# Patient Record
Sex: Male | Born: 1970 | Race: White | Hispanic: No | Marital: Married | Smoking: Former smoker
Health system: Southern US, Community
[De-identification: ages and names within clinical notes are randomized; demographics above are authoritative.]

## PROBLEM LIST (undated history)

## (undated) DIAGNOSIS — R011 Cardiac murmur, unspecified: Secondary | ICD-10-CM

## (undated) HISTORY — PX: TONSILLECTOMY: SUR1361

## (undated) HISTORY — DX: Cardiac murmur, unspecified: R01.1

---

## 2005-04-04 ENCOUNTER — Ambulatory Visit: Payer: Self-pay | Admitting: Internal Medicine

## 2009-01-05 ENCOUNTER — Ambulatory Visit: Payer: Self-pay | Admitting: Internal Medicine

## 2009-09-27 ENCOUNTER — Encounter: Payer: Self-pay | Admitting: Internal Medicine

## 2009-10-04 ENCOUNTER — Telehealth: Payer: Self-pay | Admitting: Internal Medicine

## 2009-10-10 ENCOUNTER — Ambulatory Visit: Payer: Self-pay | Admitting: Internal Medicine

## 2009-10-11 LAB — CONVERTED CEMR LAB
ALT: 37 units/L (ref 0–53)
Albumin: 4.5 g/dL (ref 3.5–5.2)
Basophils Relative: 0.1 % (ref 0.0–3.0)
Bilirubin, Direct: 0.1 mg/dL (ref 0.0–0.3)
CO2: 28 meq/L (ref 19–32)
Chloride: 104 meq/L (ref 96–112)
Cholesterol: 192 mg/dL (ref 0–200)
Eosinophils Relative: 6.3 % — ABNORMAL HIGH (ref 0.0–5.0)
HCT: 43.4 % (ref 39.0–52.0)
Hemoglobin: 14.9 g/dL (ref 13.0–17.0)
LDL Cholesterol: 132 mg/dL — ABNORMAL HIGH (ref 0–99)
Lymphs Abs: 1.5 10*3/uL (ref 0.7–4.0)
MCV: 90.7 fL (ref 78.0–100.0)
Monocytes Absolute: 0.6 10*3/uL (ref 0.1–1.0)
Neutro Abs: 3.3 10*3/uL (ref 1.4–7.7)
Neutrophils Relative %: 57.8 % (ref 43.0–77.0)
Potassium: 4.5 meq/L (ref 3.5–5.1)
RBC: 4.78 M/uL (ref 4.22–5.81)
Sodium: 139 meq/L (ref 135–145)
Total Protein: 7.6 g/dL (ref 6.0–8.3)
WBC: 5.8 10*3/uL (ref 4.5–10.5)

## 2010-01-23 ENCOUNTER — Ambulatory Visit: Payer: Self-pay | Admitting: Internal Medicine

## 2011-01-08 NOTE — Assessment & Plan Note (Signed)
Summary: CPX/CLE   Vital Signs:  Patient profile:   40 year old male Height:      72 inches Weight:      230 pounds BMI:     31.31 Temp:     98.1 degrees F oral Pulse rate:   76 / minute Pulse rhythm:   regular BP sitting:   120 / 80  (left arm) Cuff size:   regular  Vitals Entered By: Linde Gillis CMA Duncan Dull) (January 23, 2010 8:15 AM) CC: 30 minute exam   History of Present Illness: Doing well No new concerns  Not exercising that much Tends to take the winter "off" of exercise  No longer dipping  Allergies: 1)  Penicillin V Potassium (Penicillin V Potassium)  Past History:  Past medical, surgical, family and social histories (including risk factors) reviewed for relevance to current acute and chronic problems.  Past Medical History: Reviewed history from 01/05/2009 and no changes required. Unremarkable  Past Surgical History: Reviewed history from 01/05/2009 and no changes required. Denies surgical history  Family History: Reviewed history from 01/05/2009 and no changes required. Dad with DM 1 sister CAD on dad's side HTN on mom's side No prostate or colon cancer Pat GM had ?RA  Social History: Reviewed history from 01/05/2009 and no changes required. 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 Eyes:  Denies double vision and vision loss-1 eye. ENT:  Denies decreased hearing and ringing in ears; teeth okay--regular with dentist. CV:  Denies chest pain or discomfort, difficulty breathing at night, difficulty breathing while lying down, fainting, lightheadness, palpitations, and shortness of breath with exertion. Resp:  Denies cough and shortness of breath. GI:  Denies abdominal pain, bloody stools, change in bowel habits, dark tarry stools, indigestion, nausea, and vomiting. GU:  Denies  erectile dysfunction, urinary frequency, and urinary hesitancy. MS:  Denies joint pain and joint swelling. Derm:  Denies lesion(s) and rash. Neuro:  Complains of tingling; denies headaches, numbness, and weakness; notes some tingling in hands in bed at times. Psych:  Denies anxiety and depression. Heme:  Denies abnormal bruising and enlarge lymph nodes. Allergy:  Denies seasonal allergies and sneezing.  Physical Exam  General:  alert and normal appearance.   Eyes:  pupils equal, pupils round, pupils reactive to light, and no optic disk abnormalities.   Ears:  R ear normal and L ear normal.   Mouth:  no erythema and no lesions.   Neck:  supple, no masses, no thyromegaly, no carotid bruits, and no cervical lymphadenopathy.   Lungs:  normal respiratory effort and normal breath sounds.   Heart:  normal rate, regular rhythm, no murmur, and no gallop.   Abdomen:  soft, non-tender, and no masses.   Msk:  no joint tenderness and no joint swelling.   Pulses:  normal in feet Extremities:  no edema Neurologic:  alert & oriented X3 and strength normal in all extremities.   Skin:  no rashes and no suspicious lesions.   Axillary Nodes:  No palpable lymphadenopathy Psych:  normally interactive, good eye contact, not anxious appearing, and not depressed appearing.     Impression & Recommendations:  Problem # 1:  HEALTH MAINTENANCE EXAM (ICD-V70.0) Assessment Comment Only healthy but not in great shape discussed fitness  Patient Instructions: 1)  Please schedule a follow-up appointment in 2 years.   Prior Medications (  reviewed today): None Current Allergies (reviewed today): PENICILLIN V POTASSIUM (PENICILLIN V POTASSIUM)

## 2012-05-25 ENCOUNTER — Ambulatory Visit: Payer: Self-pay

## 2012-05-25 LAB — RAPID STREP-A WITH REFLX: Micro Text Report: NEGATIVE

## 2012-05-27 LAB — BETA STREP CULTURE(ARMC)

## 2013-03-19 ENCOUNTER — Ambulatory Visit: Payer: Self-pay | Admitting: Family Medicine

## 2015-03-27 ENCOUNTER — Ambulatory Visit
Admit: 2015-03-27 | Disposition: A | Discharge status: Home / Self Care | Payer: Self-pay | Attending: Family Medicine | Admitting: Family Medicine

## 2017-06-24 ENCOUNTER — Encounter: Payer: Self-pay | Admitting: Internal Medicine

## 2017-06-24 ENCOUNTER — Ambulatory Visit (INDEPENDENT_AMBULATORY_CARE_PROVIDER_SITE_OTHER): Payer: BLUE CROSS/BLUE SHIELD | Admitting: Internal Medicine

## 2017-06-24 DIAGNOSIS — Z0000 Encounter for general adult medical examination without abnormal findings: Secondary | ICD-10-CM

## 2017-06-24 LAB — LIPID PANEL
CHOL/HDL RATIO: 4
Cholesterol: 226 mg/dL — ABNORMAL HIGH (ref 0–200)
HDL: 56 mg/dL (ref >39.00)
LDL Cholesterol: 144 mg/dL — ABNORMAL HIGH (ref 0–99)
NONHDL: 169.66
TRIGLYCERIDES: 128 mg/dL (ref 0.0–149.0)
VLDL: 25.6 mg/dL (ref 0.0–40.0)

## 2017-06-24 LAB — GLUCOSE, RANDOM: Glucose, Bld: 91 mg/dL (ref 70–99)

## 2017-06-24 NOTE — Assessment & Plan Note (Signed)
Working on fitness UTD on Tdap---discussed yearly flu vaccine No cancer screening due to age Check lipid and glucose

## 2017-06-24 NOTE — Progress Notes (Signed)
Subjective:    Patient ID: Ethan Aguilar., male    DOB: 1971/03/17, 46 y.o.   MRN: [id number]  HPI Here to reestablish care and for PE  No health concerns Knows he has not been as active FH of diabetes   Has started walking more regularly--runs up the hills Has lost 15# recently  No current outpatient prescriptions on file prior to visit.   No current facility-administered medications on file prior to visit.     Allergies  Allergen Reactions  . Penicillins     REACTION: rash----not sure, had something as an infant    No past medical history on file.  No past surgical history on file.  Family History  Problem Relation Age of Onset  . Cataracts Mother   . Glaucoma Mother   . Hypertension Mother   . Cataracts Father   . Diabetes Father 69  . Hypertension Father   . Tremor Sister   . Mental illness Maternal Grandmother   . Lung cancer Maternal Grandfather   . Arthritis Paternal Grandmother   . Varicose Veins Paternal Grandmother   . Heart attack Paternal Grandfather   . Prostate cancer Paternal Grandfather     Social History   Social History  . Marital status: Married    Spouse name: N/A  . Number of children: 2  . Years of education: N/A   Occupational History  . Financial planner [name]   Social History Main Topics  . Smoking status: Former Research scientist (life sciences)  . Smokeless tobacco: Former Systems developer    Types: Snuff    Quit date: 12/09/2012     Comment: Only smoked during test time in college  . Alcohol use Yes     Comment: rare  . Drug use: Unknown  . Sexual activity: Not on file   Other Topics Concern  . Not on file   Social History Narrative  . No narrative on file   Review of Systems  Constitutional: Negative for fatigue.       Wears seat belt  HENT: Negative for dental problem, hearing loss, tinnitus and trouble swallowing.        Keeps up with dentist  Eyes: Negative for visual disturbance.       No diplopia or unilateral vision loss    Respiratory: Negative for cough, chest tightness and shortness of breath.   Cardiovascular: Negative for chest pain, palpitations and leg swelling.  Gastrointestinal: Negative for abdominal pain, blood in stool and constipation.       Slight twinge of heartburn--no meds  Endocrine: Negative for polydipsia and polyuria.  Genitourinary: Negative for difficulty urinating and urgency.       No ED  Musculoskeletal: Negative for arthralgias, back pain and joint swelling.  Skin: Negative for rash.       Has seen dermatologist for benign lesions  Allergic/Immunologic: Positive for environmental allergies. Negative for immunocompromised state.       No meds  Neurological: Negative for dizziness, syncope and light-headedness.       Rare sinus headache  Hematological: Negative for adenopathy. Does not bruise/bleed easily.  Psychiatric/Behavioral: Negative for dysphoric mood and sleep disturbance. The patient is not nervous/anxious.        Objective:   Physical Exam  Constitutional: He is oriented to person, place, and time. He appears well-developed and well-nourished. No distress.  HENT:  Head: Normocephalic and atraumatic.  Right Ear: External ear normal.  Left Ear: External ear normal.  Mouth/Throat: Oropharynx is clear and  moist.  Eyes: Pupils are equal, round, and reactive to light. Conjunctivae are normal.  Neck: Normal range of motion. Neck supple. No thyromegaly present.  Cardiovascular: Normal rate, regular rhythm, normal heart sounds and intact distal pulses.  Exam reveals no gallop.   No murmur heard. Pulmonary/Chest: Effort normal and breath sounds normal. No respiratory distress. He has no wheezes. He has no rales.  Abdominal: Soft. There is no tenderness.  Musculoskeletal: He exhibits no edema or tenderness.  Lymphadenopathy:    He has no cervical adenopathy.  Neurological: He is alert and oriented to person, place, and time.  Skin: No rash noted. No erythema.  Psychiatric:  He has a normal mood and affect. His behavior is normal.          Assessment & Plan:

## 2017-06-24 NOTE — Patient Instructions (Signed)
DASH Eating Plan DASH stands for "Dietary Approaches to Stop Hypertension." The DASH eating plan is a healthy eating plan that has been shown to reduce high blood pressure (hypertension). It may also reduce your risk for type 2 diabetes, heart disease, and stroke. The DASH eating plan may also help with weight loss. What are tips for following this plan? General guidelines  Avoid eating more than 2,300 mg (milligrams) of salt (sodium) a day. If you have hypertension, you may need to reduce your sodium intake to 1,500 mg a day.  Limit alcohol intake to no more than 1 drink a day for nonpregnant women and 2 drinks a day for men. One drink equals 12 oz of beer, 5 oz of wine, or 1 oz of hard liquor.  Work with your health care provider to maintain a healthy body weight or to lose weight. Ask what an ideal weight is for you.  Get at least 30 minutes of exercise that causes your heart to beat faster (aerobic exercise) most days of the week. Activities may include walking, swimming, or biking.  Work with your health care provider or diet and nutrition specialist (dietitian) to adjust your eating plan to your individual calorie needs. Reading food labels  Check food labels for the amount of sodium per serving. Choose foods with less than 5 percent of the Daily Value of sodium. Generally, foods with less than 300 mg of sodium per serving fit into this eating plan.  To find whole grains, look for the word "whole" as the first word in the ingredient list. Shopping  Buy products labeled as "low-sodium" or "no salt added."  Buy fresh foods. Avoid canned foods and premade or frozen meals. Cooking  Avoid adding salt when cooking. Use salt-free seasonings or herbs instead of table salt or sea salt. Check with your health care provider or pharmacist before using salt substitutes.  Do not fry foods. Cook foods using healthy methods such as baking, boiling, grilling, and broiling instead.  Cook with  heart-healthy oils, such as olive, canola, soybean, or sunflower oil. Meal planning   Eat a balanced diet that includes: ? 5 or more servings of fruits and vegetables each day. At each meal, try to fill half of your plate with fruits and vegetables. ? Up to 6-8 servings of whole grains each day. ? Less than 6 oz of lean meat, poultry, or fish each day. A 3-oz serving of meat is about the same size as a deck of cards. One egg equals 1 oz. ? 2 servings of low-fat dairy each day. ? A serving of nuts, seeds, or beans 5 times each week. ? Heart-healthy fats. Healthy fats called Omega-3 fatty acids are found in foods such as flaxseeds and coldwater fish, like sardines, salmon, and mackerel.  Limit how much you eat of the following: ? Canned or prepackaged foods. ? Food that is high in trans fat, such as fried foods. ? Food that is high in saturated fat, such as fatty meat. ? Sweets, desserts, sugary drinks, and other foods with added sugar. ? Full-fat dairy products.  Do not salt foods before eating.  Try to eat at least 2 vegetarian meals each week.  Eat more home-cooked food and less restaurant, buffet, and fast food.  When eating at a restaurant, ask that your food be prepared with less salt or no salt, if possible. What foods are recommended? The items listed may not be a complete list. Talk with your dietitian about what   dietary choices are best for you. Grains Whole-grain or whole-wheat bread. Whole-grain or whole-wheat pasta. Brown rice. Oatmeal. Quinoa. Bulgur. Whole-grain and low-sodium cereals. Pita bread. Low-fat, low-sodium crackers. Whole-wheat flour tortillas. Vegetables Fresh or frozen vegetables (raw, steamed, roasted, or grilled). Low-sodium or reduced-sodium tomato and vegetable juice. Low-sodium or reduced-sodium tomato sauce and tomato paste. Low-sodium or reduced-sodium canned vegetables. Fruits All fresh, dried, or frozen fruit. Canned fruit in natural juice (without  added sugar). Meat and other protein foods Skinless chicken or turkey. Ground chicken or turkey. Pork with fat trimmed off. Fish and seafood. Egg whites. Dried beans, peas, or lentils. Unsalted nuts, nut butters, and seeds. Unsalted canned beans. Lean cuts of beef with fat trimmed off. Low-sodium, lean deli meat. Dairy Low-fat (1%) or fat-free (skim) milk. Fat-free, low-fat, or reduced-fat cheeses. Nonfat, low-sodium ricotta or cottage cheese. Low-fat or nonfat yogurt. Low-fat, low-sodium cheese. Fats and oils Soft margarine without trans fats. Vegetable oil. Low-fat, reduced-fat, or light mayonnaise and salad dressings (reduced-sodium). Canola, safflower, olive, soybean, and sunflower oils. Avocado. Seasoning and other foods Herbs. Spices. Seasoning mixes without salt. Unsalted popcorn and pretzels. Fat-free sweets. What foods are not recommended? The items listed may not be a complete list. Talk with your dietitian about what dietary choices are best for you. Grains Baked goods made with fat, such as croissants, muffins, or some breads. Dry pasta or rice meal packs. Vegetables Creamed or fried vegetables. Vegetables in a cheese sauce. Regular canned vegetables (not low-sodium or reduced-sodium). Regular canned tomato sauce and paste (not low-sodium or reduced-sodium). Regular tomato and vegetable juice (not low-sodium or reduced-sodium). Pickles. Olives. Fruits Canned fruit in a light or heavy syrup. Fried fruit. Fruit in cream or butter sauce. Meat and other protein foods Fatty cuts of meat. Ribs. Fried meat. Bacon. Sausage. Bologna and other processed lunch meats. Salami. Fatback. Hotdogs. Bratwurst. Salted nuts and seeds. Canned beans with added salt. Canned or smoked fish. Whole eggs or egg yolks. Chicken or turkey with skin. Dairy Whole or 2% milk, cream, and half-and-half. Whole or full-fat cream cheese. Whole-fat or sweetened yogurt. Full-fat cheese. Nondairy creamers. Whipped toppings.  Processed cheese and cheese spreads. Fats and oils Butter. Stick margarine. Lard. Shortening. Ghee. Bacon fat. Tropical oils, such as coconut, palm kernel, or palm oil. Seasoning and other foods Salted popcorn and pretzels. Onion salt, garlic salt, seasoned salt, table salt, and sea salt. Worcestershire sauce. Tartar sauce. Barbecue sauce. Teriyaki sauce. Soy sauce, including reduced-sodium. Steak sauce. Canned and packaged gravies. Fish sauce. Oyster sauce. Cocktail sauce. Horseradish that you find on the shelf. Ketchup. Mustard. Meat flavorings and tenderizers. Bouillon cubes. Hot sauce and Tabasco sauce. Premade or packaged marinades. Premade or packaged taco seasonings. Relishes. Regular salad dressings. Where to find more information:  National Heart, Lung, and Blood Institute: www.nhlbi.nih.gov  American Heart Association: www.heart.org Summary  The DASH eating plan is a healthy eating plan that has been shown to reduce high blood pressure (hypertension). It may also reduce your risk for type 2 diabetes, heart disease, and stroke.  With the DASH eating plan, you should limit salt (sodium) intake to 2,300 mg a day. If you have hypertension, you may need to reduce your sodium intake to 1,500 mg a day.  When on the DASH eating plan, aim to eat more fresh fruits and vegetables, whole grains, lean proteins, low-fat dairy, and heart-healthy fats.  Work with your health care provider or diet and nutrition specialist (dietitian) to adjust your eating plan to your individual   calorie needs. This information is not intended to replace advice given to you by your health care provider. Make sure you discuss any questions you have with your health care provider. Document Released: 11/14/2011 Document Revised: 11/18/2016 Document Reviewed: 11/18/2016 Elsevier Interactive Patient Education  2017 Elsevier Inc.  

## 2017-10-21 ENCOUNTER — Encounter: Payer: BLUE CROSS/BLUE SHIELD | Admitting: Internal Medicine

## 2018-10-21 ENCOUNTER — Ambulatory Visit (INDEPENDENT_AMBULATORY_CARE_PROVIDER_SITE_OTHER): Payer: BLUE CROSS/BLUE SHIELD | Admitting: Internal Medicine

## 2018-10-21 ENCOUNTER — Encounter: Payer: Self-pay | Admitting: Internal Medicine

## 2018-10-21 VITALS — BP 132/84 | HR 81 | Temp 98.6°F | Ht 73.5 in | Wt 248.0 lb

## 2018-10-21 DIAGNOSIS — Z23 Encounter for immunization: Secondary | ICD-10-CM | POA: Diagnosis not present

## 2018-10-21 DIAGNOSIS — Z0000 Encounter for general adult medical examination without abnormal findings: Secondary | ICD-10-CM

## 2018-10-21 LAB — LIPID PANEL
Cholesterol: 200 mg/dL (ref 0–200)
HDL: 46.8 mg/dL (ref >39.00)
LDL Cholesterol: 124 mg/dL — ABNORMAL HIGH (ref 0–99)
NONHDL: 153.11
Total CHOL/HDL Ratio: 4
Triglycerides: 146 mg/dL (ref 0.0–149.0)
VLDL: 29.2 mg/dL (ref 0.0–40.0)

## 2018-10-21 LAB — GLUCOSE, RANDOM: Glucose, Bld: 101 mg/dL — ABNORMAL HIGH (ref 70–99)

## 2018-10-21 NOTE — Progress Notes (Signed)
Subjective:    Patient ID: Ethan Riding., male    DOB: January 31, 1971, 47 y.o.   MRN: [id number]  HPI Here for physical Has biometric screen to do from work Gained weight back---was as low as 222# Is walking regularly--- 30-45 minutes several times a week  No current outpatient medications on file prior to visit.   No current facility-administered medications on file prior to visit.     Allergies  Allergen Reactions  . Penicillins     REACTION: rash----not sure, had something as an infant    History reviewed. No pertinent past medical history.  History reviewed. No pertinent surgical history.  Family History  Problem Relation Age of Onset  . Cataracts Mother   . Glaucoma Mother   . Hypertension Mother   . Cataracts Father   . Diabetes Father 101  . Hypertension Father   . Tremor Sister   . Mental illness Maternal Grandmother   . Lung cancer Maternal Grandfather   . Arthritis Paternal Grandmother   . Varicose Veins Paternal Grandmother   . Heart attack Paternal Grandfather   . Prostate cancer Paternal Grandfather     Social History   Socioeconomic History  . Marital status: Married    Spouse name: Not on file  . Number of children: 2  . Years of education: Not on file  . Highest education level: Not on file  Occupational History  . Occupation: Market researcher: [employer]  . Financial resource strain: Not on file  . Food insecurity:    Worry: Not on file    Inability: Not on file  . Transportation needs:    Medical: Not on file    Non-medical: Not on file  Tobacco Use  . Smoking status: Former Research scientist (life sciences)  . Smokeless tobacco: Former Systems developer    Types: Snuff    Quit date: 12/09/2012  . Tobacco comment: Only smoked during test time in college  Substance and Sexual Activity  . Alcohol use: Yes    Comment: rare  . Drug use: Not on file  . Sexual activity: Not on file  Lifestyle  . Physical activity:    Days per week: Not on  file    Minutes per session: Not on file  . Stress: Not on file  Relationships  . Social connections:    Talks on phone: Not on file    Gets together: Not on file    Attends religious service: Not on file    Active member of club or organization: Not on file    Attends meetings of clubs or organizations: Not on file    Relationship status: Not on file  . Intimate partner violence:    Fear of current or ex partner: Not on file    Emotionally abused: Not on file    Physically abused: Not on file    Forced sexual activity: Not on file  Other Topics Concern  . Not on file  Social History Narrative  . Not on file   Review of Systems  Constitutional: Negative for fatigue.       Wears seat belt  HENT: Negative for dental problem, hearing loss, tinnitus and trouble swallowing.        Keeps up with dentist  Eyes: Negative for visual disturbance.       No diplopia or unilateral vision loss  Respiratory: Negative for cough, chest tightness and shortness of breath.   Cardiovascular: Negative for chest  pain, palpitations and leg swelling.  Gastrointestinal: Negative for abdominal pain, blood in stool and constipation.       Farts a lot--discussed sorbitol  Endocrine: Negative for polydipsia and polyuria.  Genitourinary: Negative for difficulty urinating and urgency.       No sexual problems  Musculoskeletal: Negative for arthralgias and joint swelling.       Occ low back pain---wife can put pressure on and "pop" him back in  Skin: Negative for rash.  Allergic/Immunologic: Negative for environmental allergies and immunocompromised state.       Gets head congestion with weather changes--sinus med will help  Neurological: Negative for dizziness, syncope, light-headedness and headaches.  Hematological: Negative for adenopathy. Does not bruise/bleed easily.  Psychiatric/Behavioral: Negative for dysphoric mood and sleep disturbance. The patient is not nervous/anxious.        Objective:    Physical Exam  Constitutional: He is oriented to person, place, and time. He appears well-developed. No distress.  HENT:  Head: Normocephalic and atraumatic.  Right Ear: External ear normal.  Left Ear: External ear normal.  Mouth/Throat: Oropharynx is clear and moist. No oropharyngeal exudate.  Eyes: Pupils are equal, round, and reactive to light. Conjunctivae are normal.  Neck: Normal range of motion. No thyromegaly present.  Cardiovascular: Normal rate, regular rhythm, normal heart sounds and intact distal pulses. Exam reveals no gallop.  No murmur heard. Respiratory: Effort normal and breath sounds normal. No respiratory distress. He has no wheezes. He has no rales.  GI: Soft. There is no tenderness.  Musculoskeletal: He exhibits no edema or tenderness.  Lymphadenopathy:    He has no cervical adenopathy.  Neurological: He is alert and oriented to person, place, and time.  Skin: No rash noted. No erythema.  Psychiatric: He has a normal mood and affect.           Assessment & Plan:

## 2018-10-21 NOTE — Addendum Note (Signed)
Addended by: Pilar Grammes on: 10/21/2018 09:30 AM   Modules accepted: Orders

## 2018-10-21 NOTE — Assessment & Plan Note (Signed)
Healthy but has gotten out of shape Discussed exercise Will give flu vaccine Check lipid, sugar

## 2018-10-21 NOTE — Patient Instructions (Signed)
DASH Eating Plan DASH stands for "Dietary Approaches to Stop Hypertension." The DASH eating plan is a healthy eating plan that has been shown to reduce high blood pressure (hypertension). It may also reduce your risk for type 2 diabetes, heart disease, and stroke. The DASH eating plan may also help with weight loss. What are tips for following this plan? General guidelines  Avoid eating more than 2,300 mg (milligrams) of salt (sodium) a day. If you have hypertension, you may need to reduce your sodium intake to 1,500 mg a day.  Limit alcohol intake to no more than 1 drink a day for nonpregnant women and 2 drinks a day for men. One drink equals 12 oz of beer, 5 oz of wine, or 1 oz of hard liquor.  Work with your health care provider to maintain a healthy body weight or to lose weight. Ask what an ideal weight is for you.  Get at least 30 minutes of exercise that causes your heart to beat faster (aerobic exercise) most days of the week. Activities may include walking, swimming, or biking.  Work with your health care provider or diet and nutrition specialist (dietitian) to adjust your eating plan to your individual calorie needs. Reading food labels  Check food labels for the amount of sodium per serving. Choose foods with less than 5 percent of the Daily Value of sodium. Generally, foods with less than 300 mg of sodium per serving fit into this eating plan.  To find whole grains, look for the word "whole" as the first word in the ingredient list. Shopping  Buy products labeled as "low-sodium" or "no salt added."  Buy fresh foods. Avoid canned foods and premade or frozen meals. Cooking  Avoid adding salt when cooking. Use salt-free seasonings or herbs instead of table salt or sea salt. Check with your health care provider or pharmacist before using salt substitutes.  Do not fry foods. Cook foods using healthy methods such as baking, boiling, grilling, and broiling instead.  Cook with  heart-healthy oils, such as olive, canola, soybean, or sunflower oil. Meal planning   Eat a balanced diet that includes: ? 5 or more servings of fruits and vegetables each day. At each meal, try to fill half of your plate with fruits and vegetables. ? Up to 6-8 servings of whole grains each day. ? Less than 6 oz of lean meat, poultry, or fish each day. A 3-oz serving of meat is about the same size as a deck of cards. One egg equals 1 oz. ? 2 servings of low-fat dairy each day. ? A serving of nuts, seeds, or beans 5 times each week. ? Heart-healthy fats. Healthy fats called Omega-3 fatty acids are found in foods such as flaxseeds and coldwater fish, like sardines, salmon, and mackerel.  Limit how much you eat of the following: ? Canned or prepackaged foods. ? Food that is high in trans fat, such as fried foods. ? Food that is high in saturated fat, such as fatty meat. ? Sweets, desserts, sugary drinks, and other foods with added sugar. ? Full-fat dairy products.  Do not salt foods before eating.  Try to eat at least 2 vegetarian meals each week.  Eat more home-cooked food and less restaurant, buffet, and fast food.  When eating at a restaurant, ask that your food be prepared with less salt or no salt, if possible. What foods are recommended? The items listed may not be a complete list. Talk with your dietitian about what   dietary choices are best for you. Grains Whole-grain or whole-wheat bread. Whole-grain or whole-wheat pasta. Brown rice. Oatmeal. Quinoa. Bulgur. Whole-grain and low-sodium cereals. Pita bread. Low-fat, low-sodium crackers. Whole-wheat flour tortillas. Vegetables Fresh or frozen vegetables (raw, steamed, roasted, or grilled). Low-sodium or reduced-sodium tomato and vegetable juice. Low-sodium or reduced-sodium tomato sauce and tomato paste. Low-sodium or reduced-sodium canned vegetables. Fruits All fresh, dried, or frozen fruit. Canned fruit in natural juice (without  added sugar). Meat and other protein foods Skinless chicken or turkey. Ground chicken or turkey. Pork with fat trimmed off. Fish and seafood. Egg whites. Dried beans, peas, or lentils. Unsalted nuts, nut butters, and seeds. Unsalted canned beans. Lean cuts of beef with fat trimmed off. Low-sodium, lean deli meat. Dairy Low-fat (1%) or fat-free (skim) milk. Fat-free, low-fat, or reduced-fat cheeses. Nonfat, low-sodium ricotta or cottage cheese. Low-fat or nonfat yogurt. Low-fat, low-sodium cheese. Fats and oils Soft margarine without trans fats. Vegetable oil. Low-fat, reduced-fat, or light mayonnaise and salad dressings (reduced-sodium). Canola, safflower, olive, soybean, and sunflower oils. Avocado. Seasoning and other foods Herbs. Spices. Seasoning mixes without salt. Unsalted popcorn and pretzels. Fat-free sweets. What foods are not recommended? The items listed may not be a complete list. Talk with your dietitian about what dietary choices are best for you. Grains Baked goods made with fat, such as croissants, muffins, or some breads. Dry pasta or rice meal packs. Vegetables Creamed or fried vegetables. Vegetables in a cheese sauce. Regular canned vegetables (not low-sodium or reduced-sodium). Regular canned tomato sauce and paste (not low-sodium or reduced-sodium). Regular tomato and vegetable juice (not low-sodium or reduced-sodium). Pickles. Olives. Fruits Canned fruit in a light or heavy syrup. Fried fruit. Fruit in cream or butter sauce. Meat and other protein foods Fatty cuts of meat. Ribs. Fried meat. Bacon. Sausage. Bologna and other processed lunch meats. Salami. Fatback. Hotdogs. Bratwurst. Salted nuts and seeds. Canned beans with added salt. Canned or smoked fish. Whole eggs or egg yolks. Chicken or turkey with skin. Dairy Whole or 2% milk, cream, and half-and-half. Whole or full-fat cream cheese. Whole-fat or sweetened yogurt. Full-fat cheese. Nondairy creamers. Whipped toppings.  Processed cheese and cheese spreads. Fats and oils Butter. Stick margarine. Lard. Shortening. Ghee. Bacon fat. Tropical oils, such as coconut, palm kernel, or palm oil. Seasoning and other foods Salted popcorn and pretzels. Onion salt, garlic salt, seasoned salt, table salt, and sea salt. Worcestershire sauce. Tartar sauce. Barbecue sauce. Teriyaki sauce. Soy sauce, including reduced-sodium. Steak sauce. Canned and packaged gravies. Fish sauce. Oyster sauce. Cocktail sauce. Horseradish that you find on the shelf. Ketchup. Mustard. Meat flavorings and tenderizers. Bouillon cubes. Hot sauce and Tabasco sauce. Premade or packaged marinades. Premade or packaged taco seasonings. Relishes. Regular salad dressings. Where to find more information:  National Heart, Lung, and Blood Institute: www.nhlbi.nih.gov  American Heart Association: www.heart.org Summary  The DASH eating plan is a healthy eating plan that has been shown to reduce high blood pressure (hypertension). It may also reduce your risk for type 2 diabetes, heart disease, and stroke.  With the DASH eating plan, you should limit salt (sodium) intake to 2,300 mg a day. If you have hypertension, you may need to reduce your sodium intake to 1,500 mg a day.  When on the DASH eating plan, aim to eat more fresh fruits and vegetables, whole grains, lean proteins, low-fat dairy, and heart-healthy fats.  Work with your health care provider or diet and nutrition specialist (dietitian) to adjust your eating plan to your individual   calorie needs. This information is not intended to replace advice given to you by your health care provider. Make sure you discuss any questions you have with your health care provider. Document Released: 11/14/2011 Document Revised: 11/18/2016 Document Reviewed: 11/18/2016 Elsevier Interactive Patient Education  2018 Elsevier Inc.  

## 2019-06-09 DIAGNOSIS — Z20828 Contact with and (suspected) exposure to other viral communicable diseases: Secondary | ICD-10-CM | POA: Diagnosis not present

## 2019-06-09 DIAGNOSIS — Z0184 Encounter for antibody response examination: Secondary | ICD-10-CM | POA: Diagnosis not present

## 2019-09-14 DIAGNOSIS — D225 Melanocytic nevi of trunk: Secondary | ICD-10-CM | POA: Diagnosis not present

## 2019-09-14 DIAGNOSIS — C44219 Basal cell carcinoma of skin of left ear and external auricular canal: Secondary | ICD-10-CM | POA: Diagnosis not present

## 2019-09-14 DIAGNOSIS — X32XXXA Exposure to sunlight, initial encounter: Secondary | ICD-10-CM | POA: Diagnosis not present

## 2019-09-14 DIAGNOSIS — D2262 Melanocytic nevi of left upper limb, including shoulder: Secondary | ICD-10-CM | POA: Diagnosis not present

## 2019-09-14 DIAGNOSIS — D2261 Melanocytic nevi of right upper limb, including shoulder: Secondary | ICD-10-CM | POA: Diagnosis not present

## 2019-09-14 DIAGNOSIS — D2271 Melanocytic nevi of right lower limb, including hip: Secondary | ICD-10-CM | POA: Diagnosis not present

## 2019-09-14 DIAGNOSIS — L570 Actinic keratosis: Secondary | ICD-10-CM | POA: Diagnosis not present

## 2019-09-14 DIAGNOSIS — D485 Neoplasm of uncertain behavior of skin: Secondary | ICD-10-CM | POA: Diagnosis not present

## 2019-10-25 DIAGNOSIS — C44219 Basal cell carcinoma of skin of left ear and external auricular canal: Secondary | ICD-10-CM | POA: Diagnosis not present

## 2019-10-25 DIAGNOSIS — C44319 Basal cell carcinoma of skin of other parts of face: Secondary | ICD-10-CM | POA: Diagnosis not present

## 2019-10-29 ENCOUNTER — Other Ambulatory Visit: Payer: Self-pay

## 2019-10-29 ENCOUNTER — Ambulatory Visit (INDEPENDENT_AMBULATORY_CARE_PROVIDER_SITE_OTHER): Payer: BC Managed Care – PPO | Admitting: Internal Medicine

## 2019-10-29 ENCOUNTER — Encounter: Payer: Self-pay | Admitting: Internal Medicine

## 2019-10-29 VITALS — BP 118/80 | HR 88 | Temp 97.6°F | Ht 73.75 in | Wt 250.0 lb

## 2019-10-29 DIAGNOSIS — Z1211 Encounter for screening for malignant neoplasm of colon: Secondary | ICD-10-CM

## 2019-10-29 DIAGNOSIS — Z0000 Encounter for general adult medical examination without abnormal findings: Secondary | ICD-10-CM | POA: Diagnosis not present

## 2019-10-29 LAB — LIPID PANEL
Cholesterol: 207 mg/dL — ABNORMAL HIGH (ref 0–200)
HDL: 45.6 mg/dL (ref >39.00)
LDL Cholesterol: 136 mg/dL — ABNORMAL HIGH (ref 0–99)
NonHDL: 161.76
Total CHOL/HDL Ratio: 5
Triglycerides: 127 mg/dL (ref 0.0–149.0)
VLDL: 25.4 mg/dL (ref 0.0–40.0)

## 2019-10-29 LAB — GLUCOSE, RANDOM: Glucose, Bld: 100 mg/dL — ABNORMAL HIGH (ref 70–99)

## 2019-10-29 NOTE — Assessment & Plan Note (Signed)
Healthy Had flu vaccine Discussed fitness, healthy eating----needs to lose some weight Will set up colon

## 2019-10-29 NOTE — Progress Notes (Signed)
Subjective:    Patient ID: Ethan Aguilar., male    DOB: December 10, 1970, 48 y.o.   MRN: [id number]  HPI Here for physical This visit occurred during the SARS-CoV-2 public health emergency.  Safety protocols were in place, including screening questions prior to the visit, additional usage of staff PPE, and extensive cleaning of exam room while observing appropriate contact time as indicated for disinfecting solutions.   Just had basal cell carcinoma removed from left preauricular space Dr Evorn Gong  No other concerns Has been walking 4 days per week----but hasn't lost weight Discussed resistance training Tries to eat healthy--cooking more, etc  No current outpatient medications on file prior to visit.   No current facility-administered medications on file prior to visit.     Allergies  Allergen Reactions  . Nsaids Hives  . Penicillins     REACTION: rash----not sure, had something as an infant    History reviewed. No pertinent past medical history.  History reviewed. No pertinent surgical history.  Family History  Problem Relation Age of Onset  . Cataracts Mother   . Glaucoma Mother   . Hypertension Mother   . Cataracts Father   . Diabetes Father 23  . Hypertension Father   . Tremor Sister   . Mental illness Maternal Grandmother   . Lung cancer Maternal Grandfather   . Arthritis Paternal Grandmother   . Varicose Veins Paternal Grandmother   . Heart attack Paternal Grandfather   . Prostate cancer Paternal Grandfather     Social History   Socioeconomic History  . Marital status: Married    Spouse name: Not on file  . Number of children: 2  . Years of education: Not on file  . Highest education level: Not on file  Occupational History  . Occupation: Market researcher: [employer]  . Financial resource strain: Not on file  . Food insecurity    Worry: Not on file    Inability: Not on file  . Transportation needs    Medical: Not  on file    Non-medical: Not on file  Tobacco Use  . Smoking status: Former Research scientist (life sciences)  . Smokeless tobacco: Former Systems developer    Types: Snuff    Quit date: 12/09/2012  . Tobacco comment: Only smoked during test time in college  Substance and Sexual Activity  . Alcohol use: Yes    Comment: rare  . Drug use: Not on file  . Sexual activity: Not on file  Lifestyle  . Physical activity    Days per week: Not on file    Minutes per session: Not on file  . Stress: Not on file  Relationships  . Social Herbalist on phone: Not on file    Gets together: Not on file    Attends religious service: Not on file    Active member of club or organization: Not on file    Attends meetings of clubs or organizations: Not on file    Relationship status: Not on file  . Intimate partner violence    Fear of current or ex partner: Not on file    Emotionally abused: Not on file    Physically abused: Not on file    Forced sexual activity: Not on file  Other Topics Concern  . Not on file  Social History Narrative  . Not on file   Review of Systems  Constitutional: Negative for fatigue and unexpected weight change.  Wears seat belt  HENT: Negative for dental problem, hearing loss and tinnitus.        Keeps up with dentist  Eyes: Negative for visual disturbance.       No diplopia or unilateral vision loss  Respiratory: Negative for cough, chest tightness and shortness of breath.   Cardiovascular: Negative for chest pain, palpitations and leg swelling.  Gastrointestinal: Negative for abdominal pain, blood in stool and constipation.       No heartburn  Endocrine: Negative for polydipsia and polyuria.  Genitourinary: Negative for difficulty urinating and urgency.       Slight dribbling Lower sexual drive but ED  Musculoskeletal: Negative for arthralgias and joint swelling.       Having some leg cramps--mostly after heavy exertion  Skin: Negative for rash.  Allergic/Immunologic: Negative for  environmental allergies and immunocompromised state.  Neurological: Negative for dizziness, syncope, light-headedness and headaches.  Hematological: Negative for adenopathy. Does not bruise/bleed easily.  Psychiatric/Behavioral: Negative for dysphoric mood and sleep disturbance. The patient is not nervous/anxious.        Objective:   Physical Exam  Constitutional: He is oriented to person, place, and time. He appears well-developed. No distress.  HENT:  Head: Normocephalic and atraumatic.  Right Ear: External ear normal.  Left Ear: External ear normal.  Mouth/Throat: Oropharynx is clear and moist. No oropharyngeal exudate.  Eyes: Pupils are equal, round, and reactive to light. Conjunctivae are normal.  Neck: No thyromegaly present.  Cardiovascular: Normal rate, regular rhythm, normal heart sounds and intact distal pulses. Exam reveals no gallop.  No murmur heard. Respiratory: Effort normal and breath sounds normal. No respiratory distress. He has no wheezes. He has no rales.  GI: Soft. There is no abdominal tenderness.  Musculoskeletal:        General: No tenderness or edema.  Lymphadenopathy:    He has no cervical adenopathy.  Neurological: He is alert and oriented to person, place, and time.  Skin: No rash noted. No erythema.  Psychiatric: He has a normal mood and affect. His behavior is normal.           Assessment & Plan:

## 2019-11-01 ENCOUNTER — Encounter: Payer: Self-pay | Admitting: Internal Medicine

## 2019-11-12 ENCOUNTER — Other Ambulatory Visit: Payer: Self-pay

## 2019-11-12 ENCOUNTER — Encounter: Payer: Self-pay | Admitting: Internal Medicine

## 2019-11-12 ENCOUNTER — Ambulatory Visit (AMBULATORY_SURGERY_CENTER): Payer: BC Managed Care – PPO | Admitting: *Deleted

## 2019-11-12 VITALS — Temp 97.2°F | Ht 73.75 in | Wt 248.0 lb

## 2019-11-12 DIAGNOSIS — Z1211 Encounter for screening for malignant neoplasm of colon: Secondary | ICD-10-CM

## 2019-11-12 NOTE — Progress Notes (Signed)
Patient is here in-person for PV. Patient denies any allergies to eggs or soy. Patient denies any problems with anesthesia/sedation. Patient denies any oxygen use at home. Patient denies taking any diet/weight loss medications or blood thinners. Patient is not being treated for MRSA or C-diff. EMMI education assisgned to the patient for the procedure, this was explained and instructions given to patient. COVID-19 screening test is on 12/11 Friday at [facility] , the pt is aware. Pt is aware that care partner will wait in the car during procedure; if they feel like they will be too hot or cold to wait in the car; they may wait in the 4 th floor lobby. Patient is aware to bring only one care partner. We want them to wear a mask (we do not have any that we can provide them), practice social distancing, and we will check their temperatures when they get here.  I did remind the patient that their care partner needs to stay in the parking lot the entire time and have a cell phone available, we will call them when the pt is ready for discharge. Patient will wear mask into building.

## 2019-11-19 ENCOUNTER — Other Ambulatory Visit
Admission: RE | Admit: 2019-11-19 | Discharge: 2019-11-19 | Disposition: A | Discharge status: Home / Self Care | Payer: BC Managed Care – PPO | Source: Ambulatory Visit | Attending: Internal Medicine | Admitting: Internal Medicine

## 2019-11-19 DIAGNOSIS — Z20828 Contact with and (suspected) exposure to other viral communicable diseases: Secondary | ICD-10-CM | POA: Diagnosis not present

## 2019-11-19 DIAGNOSIS — Z01812 Encounter for preprocedural laboratory examination: Secondary | ICD-10-CM | POA: Diagnosis not present

## 2019-11-20 LAB — SARS CORONAVIRUS 2 (TAT 6-24 HRS): SARS Coronavirus 2: NEGATIVE

## 2019-11-24 ENCOUNTER — Ambulatory Visit (AMBULATORY_SURGERY_CENTER): Payer: BC Managed Care – PPO | Admitting: Internal Medicine

## 2019-11-24 ENCOUNTER — Other Ambulatory Visit: Payer: Self-pay

## 2019-11-24 ENCOUNTER — Encounter: Payer: Self-pay | Admitting: Internal Medicine

## 2019-11-24 VITALS — BP 115/78 | HR 70 | Temp 97.7°F | Resp 16 | Ht 73.75 in | Wt 248.0 lb

## 2019-11-24 DIAGNOSIS — D125 Benign neoplasm of sigmoid colon: Secondary | ICD-10-CM | POA: Diagnosis not present

## 2019-11-24 DIAGNOSIS — Z1211 Encounter for screening for malignant neoplasm of colon: Secondary | ICD-10-CM

## 2019-11-24 MED ORDER — SODIUM CHLORIDE 0.9 % IV SOLN: 500.0000 mL | Freq: Once | INTRAVENOUS | Status: DC

## 2019-11-24 NOTE — Op Note (Signed)
[place] Patient Name: Ethan Aguilar Procedure Date: 11/24/2019 10:50 AM MRN: [id number] Endoscopist: Gatha Mayer , MD Age: 48 Referring MD:  Date of Birth: [birth year] Gender: Male Account #: [id number] Procedure:                Colonoscopy Indications:              Screening for colorectal malignant neoplasm, This                            is the patient's first colonoscopy Medicines:                Propofol per Anesthesia, Monitored Anesthesia Care Procedure:                Pre-Anesthesia Assessment:                           - Prior to the procedure, a History and Physical                            was performed, and patient medications and                            allergies were reviewed. The patient's tolerance of                            previous anesthesia was also reviewed. The risks                            and benefits of the procedure and the sedation                            options and risks were discussed with the patient.                            All questions were answered, and informed consent                            was obtained. Prior Anticoagulants: The patient has                            taken no previous anticoagulant or antiplatelet                            agents. ASA Grade Assessment: II - A patient with                            mild systemic disease. After reviewing the risks                            and benefits, the patient was deemed in                            satisfactory condition to undergo the procedure.  After obtaining informed consent, the colonoscope                            was passed under direct vision. Throughout the                            procedure, the patient's blood pressure, pulse, and                            oxygen saturations were monitored continuously. The                            Colonoscope was introduced through the anus and   advanced to the the cecum, identified by                            appendiceal orifice and ileocecal valve. The                            colonoscopy was performed without difficulty. The                            patient tolerated the procedure well. The quality                            of the bowel preparation was excellent. The bowel                            preparation used was Miralax via split dose                            instruction. The ileocecal valve, appendiceal                            orifice, and rectum were photographed. Scope In: 11:05:51 AM Scope Out: 11:21:02 AM Scope Withdrawal Time: 0 hours 11 minutes 3 seconds  Total Procedure Duration: 0 hours 15 minutes 11 seconds  Findings:                 The perianal and digital rectal examinations were                            normal. Pertinent negatives include normal prostate                            (size, shape, and consistency).                           A 1 to 2 mm polyp was found in the sigmoid colon.                            The polyp was sessile. The polyp was removed with a  cold biopsy forceps. Resection and retrieval were                            complete. Verification of patient identification                            for the specimen was done. Estimated blood loss was                            minimal.                           The exam was otherwise without abnormality on                            direct and retroflexion views. Complications:            No immediate complications. Estimated Blood Loss:     Estimated blood loss was minimal. Impression:               - One 1 to 2 mm polyp in the sigmoid colon, removed                            with a cold biopsy forceps. Resected and retrieved.                           - The examination was otherwise normal on direct                            and retroflexion views. Recommendation:           - Patient has a  contact number available for                            emergencies. The signs and symptoms of potential                            delayed complications were discussed with the                            patient. Return to normal activities tomorrow.                            Written discharge instructions were provided to the                            patient.                           - Resume previous diet.                           - Continue present medications.                           - Repeat colonoscopy is recommended. The  colonoscopy date will be determined after pathology                            results from today's exam become available for                            review. Gatha Mayer, MD 11/24/2019 11:27:53 AM This report has been signed electronically.

## 2019-11-24 NOTE — Progress Notes (Signed)
VS-CW Temp-LC  Pt's states no medical or surgical changes since previsit or office visit.

## 2019-11-24 NOTE — Patient Instructions (Addendum)
I found and removed one tiny polyp that is benign but could be pre-cancerous. I will let you know pathology results and when to have another routine colonoscopy by mail and/or My Chart.  All else ok - normal.  I appreciate the opportunity to care for you. Gatha Mayer, MD, [facility]  Handout given for polyps.  YOU HAD AN ENDOSCOPIC PROCEDURE TODAY AT [facility]:   Refer to the procedure report that was given to you for any specific questions about what was found during the examination.  If the procedure report does not answer your questions, please call your gastroenterologist to clarify.  If you requested that your care partner not be given the details of your procedure findings, then the procedure report has been included in a sealed envelope for you to review at your convenience later.  YOU SHOULD EXPECT: Some feelings of bloating in the abdomen. Passage of more gas than usual.  Walking can help get rid of the air that was put into your GI tract during the procedure and reduce the bloating. If you had a lower endoscopy (such as a colonoscopy or flexible sigmoidoscopy) you may notice spotting of blood in your stool or on the toilet paper. If you underwent a bowel prep for your procedure, you may not have a normal bowel movement for a few days.  Please Note:  You might notice some irritation and congestion in your nose or some drainage.  This is from the oxygen used during your procedure.  There is no need for concern and it should clear up in a day or so.  SYMPTOMS TO REPORT IMMEDIATELY:   Following lower endoscopy (colonoscopy or flexible sigmoidoscopy):  Excessive amounts of blood in the stool  Significant tenderness or worsening of abdominal pains  Swelling of the abdomen that is new, acute  Fever of 100F or higher  For urgent or emergent issues, a gastroenterologist can be reached at any hour by calling [phone].   DIET:  We do recommend a small meal at first,  but then you may proceed to your regular diet.  Drink plenty of fluids but you should avoid alcoholic beverages for 24 hours.  ACTIVITY:  You should plan to take it easy for the rest of today and you should NOT DRIVE or use heavy machinery until tomorrow (because of the sedation medicines used during the test).    FOLLOW UP: Our staff will call the number listed on your records 48-72 hours following your procedure to check on you and address any questions or concerns that you may have regarding the information given to you following your procedure. If we do not reach you, we will leave a message.  We will attempt to reach you two times.  During this call, we will ask if you have developed any symptoms of COVID 19. If you develop any symptoms (ie: fever, flu-like symptoms, shortness of breath, cough etc.) before then, please call [phone].  If you test positive for Covid 19 in the 2 weeks post procedure, please call and report this information to [place].    If any biopsies were taken you will be contacted by phone or by letter within the next 1-3 weeks.  Please call us at [phone] if you have not heard about the biopsies in 3 weeks.    SIGNATURES/CONFIDENTIALITY: You and/or your care partner have signed paperwork which will be entered into your electronic medical record.  These signatures attest to the fact that  that the information above on your After Visit Summary has been reviewed and is understood.  Full responsibility of the confidentiality of this discharge information lies with you and/or your care-partner.

## 2019-11-24 NOTE — Progress Notes (Signed)
PT taken to PACU. Monitors in place. VSS. Report given to RN. 

## 2019-11-24 NOTE — Progress Notes (Signed)
Called to room to assist during endoscopic procedure.  Patient ID and intended procedure confirmed with present staff. Received instructions for my participation in the procedure from the performing physician.  

## 2019-11-26 ENCOUNTER — Telehealth: Payer: Self-pay

## 2019-11-26 NOTE — Telephone Encounter (Signed)
Attempted to reach patient for post-procedure f/u call. No answer. Left message that we will make another attempt to reach him again later today and for him to please not hesitate to call us if he has any questions/concerns regarding his care. 

## 2019-11-26 NOTE — Telephone Encounter (Signed)
  Follow up Call-  Call back number 11/24/2019  Post procedure Call Back phone  # [phone]  Permission to leave phone message Yes  Some recent data might be hidden     Patient questions:  Do you have a fever, pain , or abdominal swelling? No. Pain Score  0 *  Have you tolerated food without any problems? Yes.    Have you been able to return to your normal activities? Yes.    Do you have any questions about your discharge instructions: Diet   No. Medications  No. Follow up visit  No.  Do you have questions or concerns about your Care? No.  Actions: * If pain score is 4 or above: No action needed, pain <4. 1. Have you developed a fever since your procedure? no  2.   Have you had an respiratory symptoms (SOB or cough) since your procedure? no  3.   Have you tested positive for COVID 19 since your procedure no  4.   Have you had any family members/close contacts diagnosed with the COVID 19 since your procedure?  no   If yes to any of these questions please route to Joylene John, RN and Alphonsa Gin, Therapist, sports.

## 2019-12-08 ENCOUNTER — Encounter: Payer: Self-pay | Admitting: Internal Medicine

## 2019-12-08 DIAGNOSIS — Z860101 Personal history of adenomatous and serrated colon polyps: Secondary | ICD-10-CM

## 2019-12-08 DIAGNOSIS — Z86010 Personal history of colonic polyps: Secondary | ICD-10-CM

## 2019-12-08 HISTORY — DX: Personal history of adenomatous and serrated colon polyps: Z86.0101

## 2019-12-08 HISTORY — DX: Personal history of colonic polyps: Z86.010

## 2020-02-28 DIAGNOSIS — D485 Neoplasm of uncertain behavior of skin: Secondary | ICD-10-CM | POA: Diagnosis not present

## 2020-02-28 DIAGNOSIS — R208 Other disturbances of skin sensation: Secondary | ICD-10-CM | POA: Diagnosis not present

## 2020-02-28 DIAGNOSIS — D2271 Melanocytic nevi of right lower limb, including hip: Secondary | ICD-10-CM | POA: Diagnosis not present

## 2020-02-28 DIAGNOSIS — D225 Melanocytic nevi of trunk: Secondary | ICD-10-CM | POA: Diagnosis not present

## 2020-02-28 DIAGNOSIS — L570 Actinic keratosis: Secondary | ICD-10-CM | POA: Diagnosis not present

## 2020-02-28 DIAGNOSIS — Z85828 Personal history of other malignant neoplasm of skin: Secondary | ICD-10-CM | POA: Diagnosis not present

## 2020-02-28 DIAGNOSIS — X32XXXA Exposure to sunlight, initial encounter: Secondary | ICD-10-CM | POA: Diagnosis not present

## 2020-02-28 DIAGNOSIS — D2272 Melanocytic nevi of left lower limb, including hip: Secondary | ICD-10-CM | POA: Diagnosis not present

## 2020-02-29 DIAGNOSIS — C44719 Basal cell carcinoma of skin of left lower limb, including hip: Secondary | ICD-10-CM | POA: Diagnosis not present

## 2020-02-29 DIAGNOSIS — D1739 Benign lipomatous neoplasm of skin and subcutaneous tissue of other sites: Secondary | ICD-10-CM | POA: Diagnosis not present

## 2020-03-28 DIAGNOSIS — C44719 Basal cell carcinoma of skin of left lower limb, including hip: Secondary | ICD-10-CM | POA: Diagnosis not present

## 2020-07-31 DIAGNOSIS — D2262 Melanocytic nevi of left upper limb, including shoulder: Secondary | ICD-10-CM | POA: Diagnosis not present

## 2020-07-31 DIAGNOSIS — D2261 Melanocytic nevi of right upper limb, including shoulder: Secondary | ICD-10-CM | POA: Diagnosis not present

## 2020-07-31 DIAGNOSIS — D225 Melanocytic nevi of trunk: Secondary | ICD-10-CM | POA: Diagnosis not present

## 2020-07-31 DIAGNOSIS — Z85828 Personal history of other malignant neoplasm of skin: Secondary | ICD-10-CM | POA: Diagnosis not present

## 2020-10-31 ENCOUNTER — Other Ambulatory Visit: Payer: Self-pay

## 2020-10-31 ENCOUNTER — Encounter: Payer: Self-pay | Admitting: Internal Medicine

## 2020-10-31 ENCOUNTER — Ambulatory Visit (INDEPENDENT_AMBULATORY_CARE_PROVIDER_SITE_OTHER): Payer: BC Managed Care – PPO | Admitting: Internal Medicine

## 2020-10-31 VITALS — BP 102/78 | HR 83 | Temp 97.4°F | Ht 73.5 in | Wt 233.0 lb

## 2020-10-31 DIAGNOSIS — Z0000 Encounter for general adult medical examination without abnormal findings: Secondary | ICD-10-CM | POA: Diagnosis not present

## 2020-10-31 NOTE — Assessment & Plan Note (Signed)
Healthy Has worked well on fitness Had COVID and flu vaccine Will consider shingrix--but wants to hold off now Colon due again in 2028 Defer PSA till 31

## 2020-10-31 NOTE — Progress Notes (Signed)
Subjective:    Patient ID: Ethan Aguilar., male    DOB: 09-26-1971, 49 y.o.   MRN: [id number]  HPI Here for physical This visit occurred during the SARS-CoV-2 public health emergency.  Safety protocols were in place, including screening questions prior to the visit, additional usage of staff PPE, and extensive cleaning of exam room while observing appropriate contact time as indicated for disinfecting solutions.   Has been working harder on fitness In gym with daughter regularly Has lost 17#  No current outpatient medications on file prior to visit.   No current facility-administered medications on file prior to visit.    Allergies  Allergen Reactions  . Nsaids Hives  . Penicillins     REACTION: rash----not sure, had something as an infant    Past Medical History:  Diagnosis Date  . Heart murmur   . Hx of adenomatous polyp of colon 12/08/2019    Past Surgical History:  Procedure Laterality Date  . TONSILLECTOMY      Family History  Problem Relation Age of Onset  . Cataracts Mother   . Glaucoma Mother   . Hypertension Mother   . Cataracts Father   . Diabetes Father 79  . Hypertension Father   . Tremor Sister   . Mental illness Maternal Grandmother   . Lung cancer Maternal Grandfather   . Stomach cancer Maternal Grandfather   . Arthritis Paternal Grandmother   . Varicose Veins Paternal Grandmother   . Heart attack Paternal Grandfather   . Prostate cancer Paternal Grandfather   . Colon cancer Neg Hx   . Colon polyps Neg Hx   . Esophageal cancer Neg Hx   . Rectal cancer Neg Hx     Social History   Socioeconomic History  . Marital status: Married    Spouse name: Not on file  . Number of children: 2  . Years of education: Not on file  . Highest education level: Not on file  Occupational History  . Occupation: Market researcher: [name]  Tobacco Use  . Smoking status: Former Research scientist (life sciences)  . Smokeless tobacco: Former Systems developer    Types:  Snuff    Quit date: 12/09/2012  . Tobacco comment: Only smoked during test time in college  Vaping Use  . Vaping Use: Never used  Substance and Sexual Activity  . Alcohol use: Yes    Alcohol/week: 1.0 standard drink    Types: 1 Standard drinks or equivalent per week    Comment: RARELY  . Drug use: Not Currently  . Sexual activity: Not on file  Other Topics Concern  . Not on file  Social History Narrative  . Not on file   Social Determinants of Health   Financial Resource Strain:   . Difficulty of Paying Living Expenses: Not on file  Food Insecurity:   . Worried About Charity fundraiser in the Last Year: Not on file  . Ran Out of Food in the Last Year: Not on file  Transportation Needs:   . Lack of Transportation (Medical): Not on file  . Lack of Transportation (Non-Medical): Not on file  Physical Activity:   . Days of Exercise per Week: Not on file  . Minutes of Exercise per Session: Not on file  Stress:   . Feeling of Stress : Not on file  Social Connections:   . Frequency of Communication with Friends and Family: Not on file  . Frequency of Social Gatherings with Friends and  Family: Not on file  . Attends Religious Services: Not on file  . Active Member of Clubs or Organizations: Not on file  . Attends Archivist Meetings: Not on file  . Marital Status: Not on file  Intimate Partner Violence:   . Fear of Current or Ex-Partner: Not on file  . Emotionally Abused: Not on file  . Physically Abused: Not on file  . Sexually Abused: Not on file   Review of Systems  Constitutional: Negative for fatigue.       Wears seat belt  HENT: Negative for dental problem, hearing loss and tinnitus.        Keeps up with dentist  Eyes: Negative for visual disturbance.       No diplopia or unilateral vision loss  Respiratory: Negative for cough, chest tightness and shortness of breath.   Cardiovascular: Negative for chest pain, palpitations and leg swelling.   Gastrointestinal: Negative for abdominal pain, blood in stool and constipation.       No heartburn   Endocrine: Negative for polydipsia and polyuria.  Genitourinary: Negative for difficulty urinating and urgency.       No sexual problems  Musculoskeletal: Negative for arthralgias, back pain and joint swelling.       Occ right sciatica---brief only (minutes)  Skin: Negative for rash.       Does keep up with derm every 6 months ([facility])  Allergic/Immunologic: Positive for environmental allergies. Negative for immunocompromised state.       No meds--very mild (or rare tylenol sinus)  Neurological: Negative for dizziness, syncope, light-headedness and headaches.  Hematological: Negative for adenopathy. Does not bruise/bleed easily.  Psychiatric/Behavioral: Negative for dysphoric mood and sleep disturbance. The patient is not nervous/anxious.        Wife notes snoring but no apnea---propping helps       Objective:   Physical Exam Constitutional:      Appearance: Normal appearance.  HENT:     Right Ear: Tympanic membrane, ear canal and external ear normal.     Left Ear: Tympanic membrane, ear canal and external ear normal.     Mouth/Throat:     Pharynx: No oropharyngeal exudate.  Eyes:     Conjunctiva/sclera: Conjunctivae normal.     Pupils: Pupils are equal, round, and reactive to light.  Cardiovascular:     Rate and Rhythm: Normal rate and regular rhythm.     Pulses: Normal pulses.     Heart sounds: No murmur heard.  No gallop.   Pulmonary:     Effort: Pulmonary effort is normal.     Breath sounds: Normal breath sounds. No wheezing or rales.  Abdominal:     Palpations: Abdomen is soft.     Tenderness: There is no abdominal tenderness.  Musculoskeletal:     Cervical back: Neck supple.     Right lower leg: No edema.     Left lower leg: No edema.  Lymphadenopathy:     Cervical: No cervical adenopathy.  Skin:    General: Skin is warm.     Findings: No rash.  Neurological:      General: No focal deficit present.     Mental Status: He is alert and oriented to person, place, and time.  Psychiatric:        Mood and Affect: Mood normal.        Behavior: Behavior normal.            Assessment & Plan:

## 2020-12-11 DIAGNOSIS — M62830 Muscle spasm of back: Secondary | ICD-10-CM | POA: Diagnosis not present

## 2020-12-11 DIAGNOSIS — M9902 Segmental and somatic dysfunction of thoracic region: Secondary | ICD-10-CM | POA: Diagnosis not present

## 2020-12-11 DIAGNOSIS — M9901 Segmental and somatic dysfunction of cervical region: Secondary | ICD-10-CM | POA: Diagnosis not present

## 2020-12-11 DIAGNOSIS — M5414 Radiculopathy, thoracic region: Secondary | ICD-10-CM | POA: Diagnosis not present

## 2021-01-01 ENCOUNTER — Ambulatory Visit (INDEPENDENT_AMBULATORY_CARE_PROVIDER_SITE_OTHER): Payer: BC Managed Care – PPO | Admitting: Family Medicine

## 2021-01-01 ENCOUNTER — Other Ambulatory Visit: Payer: Self-pay

## 2021-01-01 ENCOUNTER — Ambulatory Visit: Payer: Self-pay

## 2021-01-01 ENCOUNTER — Ambulatory Visit (INDEPENDENT_AMBULATORY_CARE_PROVIDER_SITE_OTHER): Payer: BC Managed Care – PPO

## 2021-01-01 ENCOUNTER — Encounter: Payer: Self-pay | Admitting: Family Medicine

## 2021-01-01 VITALS — BP 138/70 | HR 84 | Ht 73.5 in | Wt 240.0 lb

## 2021-01-01 DIAGNOSIS — M25511 Pain in right shoulder: Secondary | ICD-10-CM

## 2021-01-01 NOTE — Assessment & Plan Note (Addendum)
4 weeks, seemed to be more bursitis, discussed differential with some of the degenerative changes noted of the rotator cuff.  Discussed HEP  Patient work with Product/process development scientist today.  We will get an x-ray to further evaluate.  Discussed home exercises.  Icing regimen patient is allergic to anti-inflammatories so we will stay away from those.  Follow-up with me again in 4 to 6 weeks.  Worsening pain consider injection and formal physical therapy

## 2021-01-01 NOTE — Patient Instructions (Signed)
Xray on your way out Exercises Arnica lotion See me again in 4-6 weeks

## 2021-01-01 NOTE — Progress Notes (Signed)
[place] Phone: [phone] Subjective:   Ethan Aguilar, am serving as a scribe for Dr. Hulan Saas. This visit occurred during the SARS-CoV-2 public health emergency.  Safety protocols were in place, including screening questions prior to the visit, additional usage of staff PPE, and extensive cleaning of exam room while observing appropriate contact time as indicated for disinfecting solutions.   I'm seeing this patient by the request  of:  Ethan Carbon, MD  CC: right shoulder pain   [id number]  [name]. is a 50 y.o. male coming in with complaint of right shoulder pain for one month. Pulled lat bar behind his head when doing lat pulls at the gym. Felt a pop in the shoulder. Felt another pop over the weekend and his ROM improved. Pain over anterior aspect. It was in posterior aspect of shoulder. Has had numbness and tingling but this symptom has dissipated. Patient has not been lifting due to having COVID.       Past Medical History:  Diagnosis Date  . Heart murmur   . Hx of adenomatous polyp of colon 12/08/2019   Past Surgical History:  Procedure Laterality Date  . TONSILLECTOMY     Social History   Socioeconomic History  . Marital status: Married    Spouse name: Not on file  . Number of children: 2  . Years of education: Not on file  . Highest education level: Not on file  Occupational History  . Occupation: Market researcher: Ethan Aguilar  Tobacco Use  . Smoking status: Former Research scientist (life sciences)  . Smokeless tobacco: Former Systems developer    Types: Snuff    Quit date: 12/09/2012  . Tobacco comment: Only smoked during test time in college  Vaping Use  . Vaping Use: Never used  Substance and Sexual Activity  . Alcohol use: Yes    Alcohol/week: 1.0 standard drink    Types: 1 Standard drinks or equivalent per week    Comment: RARELY  . Drug use: Not Currently  . Sexual activity:  Not on file  Other Topics Concern  . Not on file  Social History Narrative  . Not on file   Social Determinants of Health   Financial Resource Strain: Not on file  Food Insecurity: Not on file  Transportation Needs: Not on file  Physical Activity: Not on file  Stress: Not on file  Social Connections: Not on file   Allergies  Allergen Reactions  . Nsaids Hives  . Penicillins     REACTION: rash----not sure, had something as an infant   Family History  Problem Relation Age of Onset  . Cataracts Mother   . Glaucoma Mother   . Hypertension Mother   . Cataracts Father   . Diabetes Father 11  . Hypertension Father   . Tremor Sister   . Mental illness Maternal Grandmother   . Lung cancer Maternal Grandfather   . Stomach cancer Maternal Grandfather   . Arthritis Paternal Grandmother   . Varicose Veins Paternal Grandmother   . Heart attack Paternal Grandfather   . Prostate cancer Paternal Grandfather   . Colon cancer Neg Hx   . Colon polyps Neg Hx   . Esophageal cancer Neg Hx   . Rectal cancer Neg Hx    Aguilar current outpatient medications on file.   Reviewed prior external information including notes and imaging from  primary care provider As well as  notes that were available from care everywhere and other healthcare systems.  Past medical history, social, surgical and family history all reviewed in electronic medical record.  Aguilar pertanent information unless stated regarding to the chief complaint.   Review of Systems:  Aguilar headache, visual changes, nausea, vomiting, diarrhea, constipation, dizziness, abdominal pain, skin rash, fevers, chills, night sweats, weight loss, swollen lymph nodes, body aches, joint swelling, chest pain, shortness of breath, mood changes. POSITIVE muscle aches  Objective  Blood pressure 138/70, pulse 84, height 6' 1.5" (1.867 m), weight 240 lb (108.9 kg), SpO2 98 %.   General: Aguilar apparent distress alert and oriented x3 mood and affect normal,  dressed appropriately.  HEENT: Pupils equal, extraocular movements intact  Respiratory: Patient's speak in full sentences and does not appear short of breath  Cardiovascular: Aguilar lower extremity edema, non tender, Aguilar erythema  Gait normal with good balance and coordination.  MSK: Right shoulder exam shows the patient does have positive impingement noted.  Patient has 5 out of 5 strength of the rotator cuff.  Patient does have some mild tightness noted with internal rotation to L5 compared to L2 on the contralateral side.  Very mild discomfort with crossover.  Limited musculoskeletal ultrasound was performed and interpreted by Ethan Aguilar  Limited ultrasound of patient's right shoulder shows patient does have very mild hypoechoic changes in the subacromial space that is consistent more with a bursitis.  Rotator cuff has some degenerative changes noted but I do not see an acute tear.  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 Impression: Subacromial bursitis  [id number]; 15 additional minutes spent for Therapeutic exercises as stated in above notes.  This included exercises focusing on stretching, strengthening, with significant focus on eccentric aspects.   Long term goals include an improvement in range of motion, strength, endurance as well as avoiding reinjury. Patient's frequency would include in 1-2 times a day, 3-5 times a week for a duration of 6-12 weeks. Shoulder Exercises that included:  Basic scapular stabilization to include adduction and depression of scapula Scaption, focusing on proper movement and good control Internal and External rotation utilizing a theraband, with elbow tucked at side entire time Rows with theraband   Proper technique shown and discussed handout in great detail with ATC.  All questions were discussed and answered.      Impression and Recommendations:     The above documentation has been reviewed and is accurate and  complete Ethan Pulley, DO

## 2021-01-30 NOTE — Progress Notes (Signed)
[place] Phone: [phone] Subjective:   [name], am serving as a scribe for Dr. Hulan Saas.  This visit occurred during the SARS-CoV-2 public health emergency.  Safety protocols were in place, including screening questions prior to the visit, additional usage of staff PPE, and extensive cleaning of exam room while observing appropriate contact time as indicated for disinfecting solutions.   I'm seeing this patient by the request  of:  Venia Carbon, MD  CC: Right shoulder pain follow-up  [id number]   01/01/2021 4 weeks, seemed to be more bursitis, discussed differential with some of the degenerative changes noted of the rotator cuff.  Discussed HEP  Patient work with Product/process development scientist today.  We will get an x-ray to further evaluate.  Discussed home exercises.  Icing regimen patient is allergic to anti-inflammatories so we will stay away from those.  Follow-up with me again in 4 to 6 weeks.  Worsening pain consider injection and formal physical therapy   Update 01/30/2021 Ethan Aguilar. is a 50 y.o. male coming in with complaint of right shoulder pain. Pain is improving but has constant dull ache. Movement that elicits majority of pain is flexion. Feels he is 85%.  Doing exercises very intermittently.  Patient states some mild discomfort at night but nothing severe.    Patient did have x-rays of the right shoulder done January 01, 2021.  Found to have some mild arthritic changes of the acromioclavicular joint but otherwise unremarkable.  Past Medical History:  Diagnosis Date  . Heart murmur   . Hx of adenomatous polyp of colon 12/08/2019   Past Surgical History:  Procedure Laterality Date  . TONSILLECTOMY     Social History   Socioeconomic History  . Marital status: Married    Spouse name: Not on file  . Number of children: 2  . Years of education: Not on file  . Highest  education level: Not on file  Occupational History  . Occupation: Market researcher: [name]  Tobacco Use  . Smoking status: Former Research scientist (life sciences)  . Smokeless tobacco: Former Systems developer    Types: Snuff    Quit date: 12/09/2012  . Tobacco comment: Only smoked during test time in college  Vaping Use  . Vaping Use: Never used  Substance and Sexual Activity  . Alcohol use: Yes    Alcohol/week: 1.0 standard drink    Types: 1 Standard drinks or equivalent per week    Comment: RARELY  . Drug use: Not Currently  . Sexual activity: Not on file  Other Topics Concern  . Not on file  Social History Narrative  . Not on file   Social Determinants of Health   Financial Resource Strain: Not on file  Food Insecurity: Not on file  Transportation Needs: Not on file  Physical Activity: Not on file  Stress: Not on file  Social Connections: Not on file   Allergies  Allergen Reactions  . Nsaids Hives  . Penicillins     REACTION: rash----not sure, had something as an infant   Family History  Problem Relation Age of Onset  . Cataracts Mother   . Glaucoma Mother   . Hypertension Mother   . Cataracts Father   . Diabetes Father 20  . Hypertension Father   . Tremor Sister   . Mental illness Maternal Grandmother   . Lung cancer Maternal Grandfather   . Stomach cancer Maternal  Grandfather   . Arthritis Paternal Grandmother   . Varicose Veins Paternal Grandmother   . Heart attack Paternal Grandfather   . Prostate cancer Paternal Grandfather   . Colon cancer Neg Hx   . Colon polyps Neg Hx   . Esophageal cancer Neg Hx   . Rectal cancer Neg Hx    No current outpatient medications on file.   Reviewed prior external information including notes and imaging from  primary care provider As well as notes that were available from care everywhere and other healthcare systems.  Past medical history, social, surgical and family history all reviewed in electronic medical record.  No pertanent  information unless stated regarding to the chief complaint.   Review of Systems:  No headache, visual changes, nausea, vomiting, diarrhea, constipation, dizziness, abdominal pain, skin rash, fevers, chills, night sweats, weight loss, swollen lymph nodes, body aches, joint swelling, chest pain, shortness of breath, mood changes.  Objective  Blood pressure 120/70, pulse 83, height 6\' 1"  (1.854 m), weight 240 lb (108.9 kg), SpO2 98 %.   General: No apparent distress alert and oriented x3 mood and affect normal, dressed appropriately.  HEENT: Pupils equal, extraocular movements intact  Respiratory: Patient's speak in full sentences and does not appear short of breath  Cardiovascular: No lower extremity edema, non tender, no erythema  Gait normal with good balance and coordination.  MSK: Right shoulder exam shows the patient does have mild positive impingement noted. Rotator cuff strength 5 out of 5.  Full range of motion otherwise.   Impression and Recommendations:    The above documentation has been reviewed and is accurate and complete Lyndal Pulley, DO

## 2021-02-01 ENCOUNTER — Ambulatory Visit: Payer: Self-pay

## 2021-02-01 ENCOUNTER — Encounter: Payer: Self-pay | Admitting: Family Medicine

## 2021-02-01 ENCOUNTER — Other Ambulatory Visit: Payer: Self-pay

## 2021-02-01 ENCOUNTER — Ambulatory Visit (INDEPENDENT_AMBULATORY_CARE_PROVIDER_SITE_OTHER): Payer: BC Managed Care – PPO | Admitting: Family Medicine

## 2021-02-01 VITALS — BP 120/70 | HR 83 | Ht 73.0 in | Wt 240.0 lb

## 2021-02-01 DIAGNOSIS — G8929 Other chronic pain: Secondary | ICD-10-CM | POA: Diagnosis not present

## 2021-02-01 DIAGNOSIS — M25511 Pain in right shoulder: Secondary | ICD-10-CM

## 2021-02-01 NOTE — Assessment & Plan Note (Signed)
Patient is significantly better at this time.  Very minimal discomfort on exam today.  We discussed the patient does have some arthritic changes of the acromioclavicular joint and worsening we can consider injection but patient is 85% better.  Follow-up with me again in 3 months

## 2021-02-01 NOTE — Patient Instructions (Signed)
Doing great Do my exercises as a cool down See me in 3 months if you need me

## 2021-03-28 DIAGNOSIS — Z85828 Personal history of other malignant neoplasm of skin: Secondary | ICD-10-CM | POA: Diagnosis not present

## 2021-03-28 DIAGNOSIS — X32XXXA Exposure to sunlight, initial encounter: Secondary | ICD-10-CM | POA: Diagnosis not present

## 2021-03-28 DIAGNOSIS — D225 Melanocytic nevi of trunk: Secondary | ICD-10-CM | POA: Diagnosis not present

## 2021-03-28 DIAGNOSIS — L570 Actinic keratosis: Secondary | ICD-10-CM | POA: Diagnosis not present

## 2021-05-01 ENCOUNTER — Ambulatory Visit: Payer: BC Managed Care – PPO | Admitting: Family Medicine

## 2021-10-22 DIAGNOSIS — Z20822 Contact with and (suspected) exposure to covid-19: Secondary | ICD-10-CM | POA: Diagnosis not present

## 2021-11-09 ENCOUNTER — Ambulatory Visit (INDEPENDENT_AMBULATORY_CARE_PROVIDER_SITE_OTHER): Payer: BC Managed Care – PPO | Admitting: Internal Medicine

## 2021-11-09 ENCOUNTER — Telehealth: Payer: Self-pay | Admitting: Internal Medicine

## 2021-11-09 ENCOUNTER — Encounter: Payer: Self-pay | Admitting: Internal Medicine

## 2021-11-09 ENCOUNTER — Other Ambulatory Visit: Payer: Self-pay

## 2021-11-09 VITALS — BP 118/78 | HR 82 | Temp 97.9°F | Ht 74.0 in | Wt 236.0 lb

## 2021-11-09 DIAGNOSIS — Z0000 Encounter for general adult medical examination without abnormal findings: Secondary | ICD-10-CM | POA: Diagnosis not present

## 2021-11-09 DIAGNOSIS — Z86010 Personal history of colonic polyps: Secondary | ICD-10-CM

## 2021-11-09 LAB — COMPREHENSIVE METABOLIC PANEL
ALT: 24 U/L (ref 0–53)
AST: 18 U/L (ref 0–37)
Albumin: 4.6 g/dL (ref 3.5–5.2)
Alkaline Phosphatase: 61 U/L (ref 39–117)
BUN: 19 mg/dL (ref 6–23)
CO2: 28 mEq/L (ref 19–32)
Calcium: 9.6 mg/dL (ref 8.4–10.5)
Chloride: 101 mEq/L (ref 96–112)
Creatinine, Ser: 1.13 mg/dL (ref 0.40–1.50)
GFR: 75.04 mL/min (ref >60.00)
Glucose, Bld: 96 mg/dL (ref 70–99)
Potassium: 4.8 mEq/L (ref 3.5–5.1)
Sodium: 136 mEq/L (ref 135–145)
Total Bilirubin: 0.5 mg/dL (ref 0.2–1.2)
Total Protein: 7 g/dL (ref 6.0–8.3)

## 2021-11-09 LAB — CBC
HCT: 43.1 % (ref 39.0–52.0)
Hemoglobin: 14.2 g/dL (ref 13.0–17.0)
MCHC: 33 g/dL (ref 30.0–36.0)
MCV: 88.4 fl (ref 78.0–100.0)
Platelets: 292 10*3/uL (ref 150.0–400.0)
RBC: 4.87 Mil/uL (ref 4.22–5.81)
RDW: 13.6 % (ref 11.5–15.5)
WBC: 4.8 10*3/uL (ref 4.0–10.5)

## 2021-11-09 NOTE — Telephone Encounter (Signed)
Pt had CPE today 11/09/21 wanted to schedule next CPE on 09/23/22 because of insurance . Explain to pt it may not be covered if it's not a full year pt insist stated he will self pay if needed

## 2021-11-09 NOTE — Assessment & Plan Note (Addendum)
Healthy Working on fitness Plans bivalent COVID and flu soon shingrix at some point Colon 2027 Consider PSA at 38

## 2021-11-09 NOTE — Assessment & Plan Note (Signed)
Next colon due 2027

## 2021-11-09 NOTE — Progress Notes (Signed)
Subjective:    Patient ID: Ethan Aguilar., male    DOB: 11-03-1971, 50 y.o.   MRN: [id number]  HPI Here for physical This visit occurred during the SARS-CoV-2 public health emergency.  Safety protocols were in place, including screening questions prior to the visit, additional usage of staff PPE, and extensive cleaning of exam room while observing appropriate contact time as indicated for disinfecting solutions.   Doing well Tested positive for COVID recently--but no symptoms Regular exercise---though limited recently due to travel/COVID  No current outpatient medications on file prior to visit.   No current facility-administered medications on file prior to visit.    Allergies  Allergen Reactions   Nsaids Hives   Penicillins     REACTION: rash----not sure, had something as an infant    Past Medical History:  Diagnosis Date   Heart murmur    Hx of adenomatous polyp of colon 12/08/2019    Past Surgical History:  Procedure Laterality Date   TONSILLECTOMY      Family History  Problem Relation Age of Onset   Cataracts Mother    Glaucoma Mother    Hypertension Mother    Cataracts Father    Diabetes Father 38   Hypertension Father    Tremor Sister    Mental illness Maternal Grandmother    Lung cancer Maternal Grandfather    Stomach cancer Maternal Grandfather    Arthritis Paternal Grandmother    Varicose Veins Paternal Grandmother    Heart attack Paternal Grandfather    Prostate cancer Paternal Grandfather    Colon cancer Neg Hx    Colon polyps Neg Hx    Esophageal cancer Neg Hx    Rectal cancer Neg Hx     Social History   Socioeconomic History   Marital status: Married    Spouse name: Not on file   Number of children: 2   Years of education: Not on file   Highest education level: Not on file  Occupational History   Occupation: Network engineer    Employer: [name]  Tobacco Use   Smoking status: Former   Smokeless tobacco: Former     Types: Snuff    Quit date: 12/09/2012   Tobacco comments:    Only smoked during test time in college  Vaping Use   Vaping Use: Never used  Substance and Sexual Activity   Alcohol use: Yes    Alcohol/week: 1.0 standard drink    Types: 1 Standard drinks or equivalent per week    Comment: RARELY   Drug use: Not Currently   Sexual activity: Not on file  Other Topics Concern   Not on file  Social History Narrative   Not on file   Social Determinants of Health   Financial Resource Strain: Not on file  Food Insecurity: Not on file  Transportation Needs: Not on file  Physical Activity: Not on file  Stress: Not on file  Social Connections: Not on file  Intimate Partner Violence: Not on file   Review of Systems  Constitutional:  Negative for fatigue and unexpected weight change.       Wears seat belt   HENT:  Negative for dental problem and tinnitus.        Mild hearing changes Keeps up with dentist  Eyes:  Negative for visual disturbance.       No diplopia or unilateral vision loss  Respiratory:  Negative for cough, chest tightness and shortness of breath.   Cardiovascular:  Negative  for chest pain, palpitations and leg swelling.  Gastrointestinal:  Negative for blood in stool and constipation.       No heartburn  Endocrine: Negative for polydipsia and polyuria.  Genitourinary:  Negative for difficulty urinating and urgency.       No sexual problems  Musculoskeletal:  Negative for arthralgias, back pain and joint swelling.       Occ cramp in right 5th finger/hand  Skin:  Negative for rash.       Keeps up with derm  Allergic/Immunologic: Negative for environmental allergies and immunocompromised state.       Gets sinus pressure with weather changes  Neurological:  Negative for syncope and headaches.       Slight lightheaded when getting out of bed  Hematological:  Negative for adenopathy. Does not bruise/bleed easily.  Psychiatric/Behavioral:  Negative for dysphoric mood and  sleep disturbance. The patient is not nervous/anxious.       Objective:   Physical Exam Constitutional:      Appearance: Normal appearance.  HENT:     Right Ear: Tympanic membrane and ear canal normal.     Left Ear: Tympanic membrane and ear canal normal.     Mouth/Throat:     Pharynx: No oropharyngeal exudate or posterior oropharyngeal erythema.  Eyes:     Conjunctiva/sclera: Conjunctivae normal.     Pupils: Pupils are equal, round, and reactive to light.  Cardiovascular:     Rate and Rhythm: Normal rate and regular rhythm.     Pulses: Normal pulses.     Heart sounds: No murmur heard.   No gallop.  Pulmonary:     Effort: Pulmonary effort is normal.     Breath sounds: Normal breath sounds. No wheezing or rales.  Abdominal:     Palpations: Abdomen is soft.     Tenderness: There is no abdominal tenderness.  Musculoskeletal:     Cervical back: Neck supple.     Right lower leg: No edema.     Left lower leg: No edema.  Lymphadenopathy:     Cervical: No cervical adenopathy.  Skin:    Findings: No lesion or rash.  Neurological:     General: No focal deficit present.     Mental Status: He is alert and oriented to person, place, and time.  Psychiatric:        Mood and Affect: Mood normal.        Behavior: Behavior normal.           Assessment & Plan:

## 2022-03-08 IMAGING — DX DG SHOULDER 2+V*R*
3 series · 3 of 3 positions shown · non-contrast
Comparison: None.

CLINICAL DATA: Right shoulder pain.  Lifting injury 1 month ago.

EXAM:
RIGHT SHOULDER - 2+ VIEW

[shoulder ap (1 of 2)]
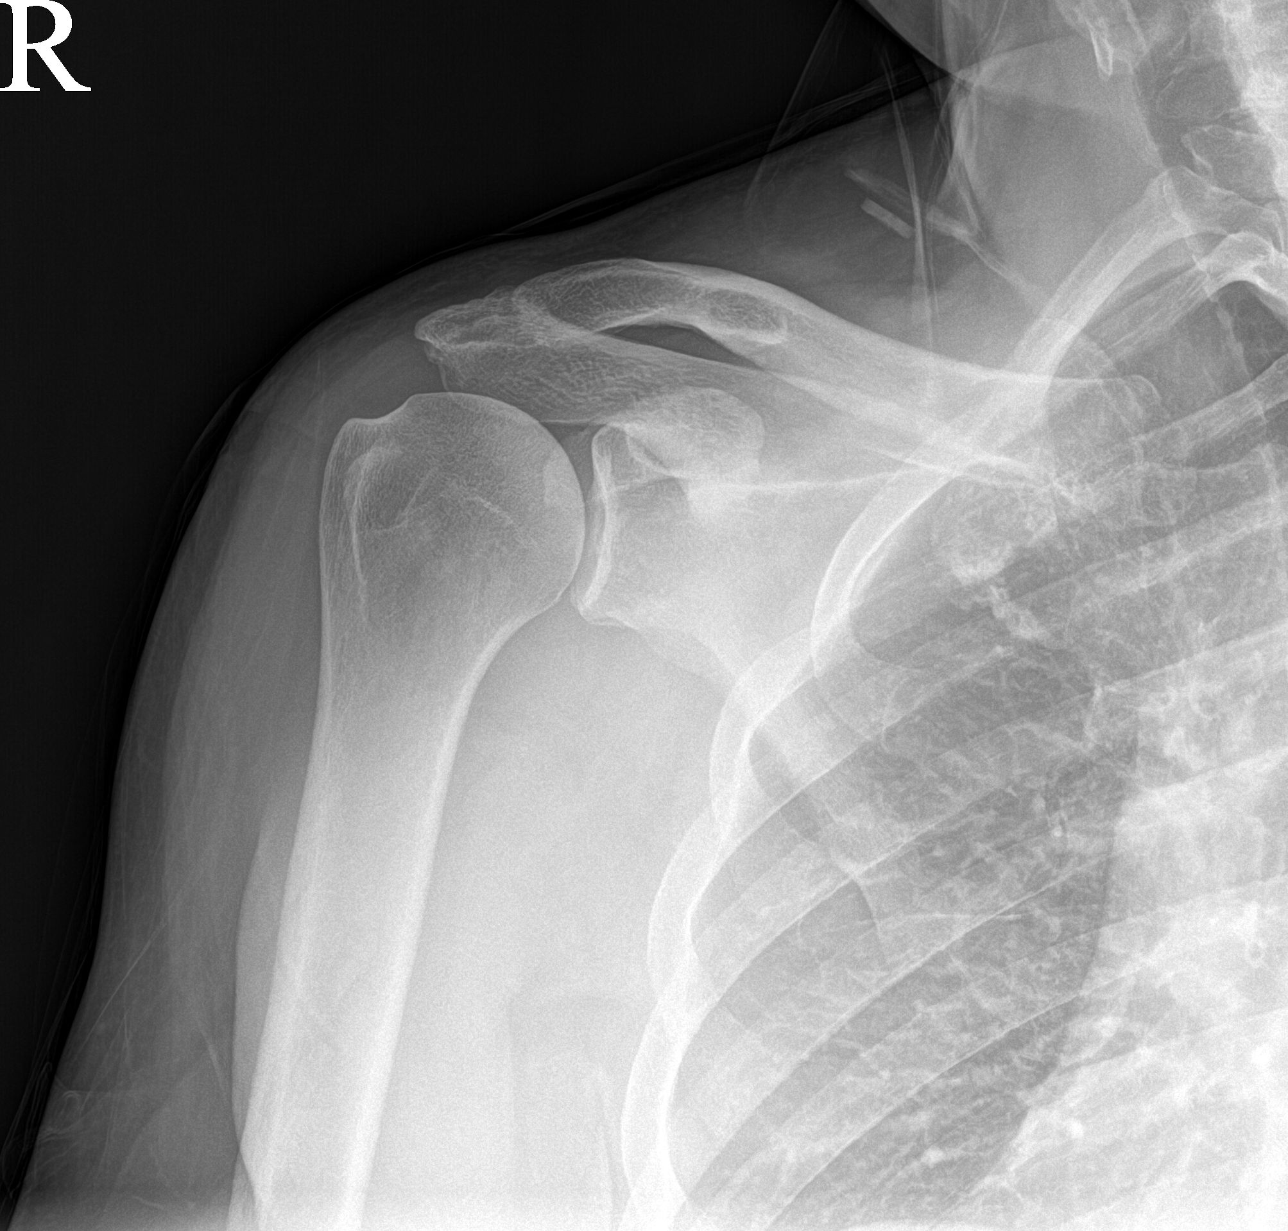

[shoulder ap (2 of 2)]
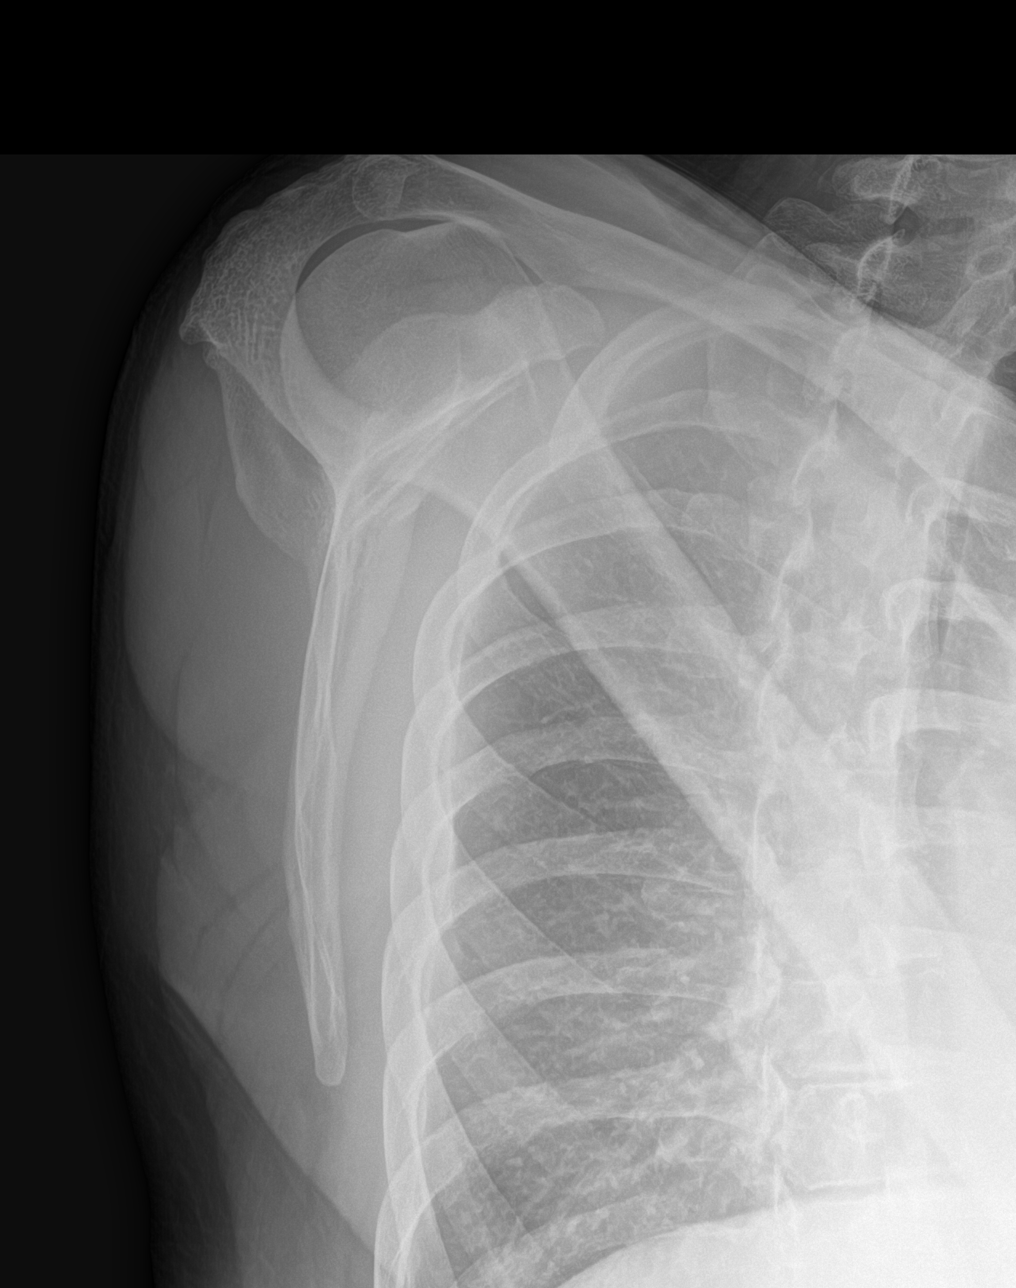

[shoulder axial]
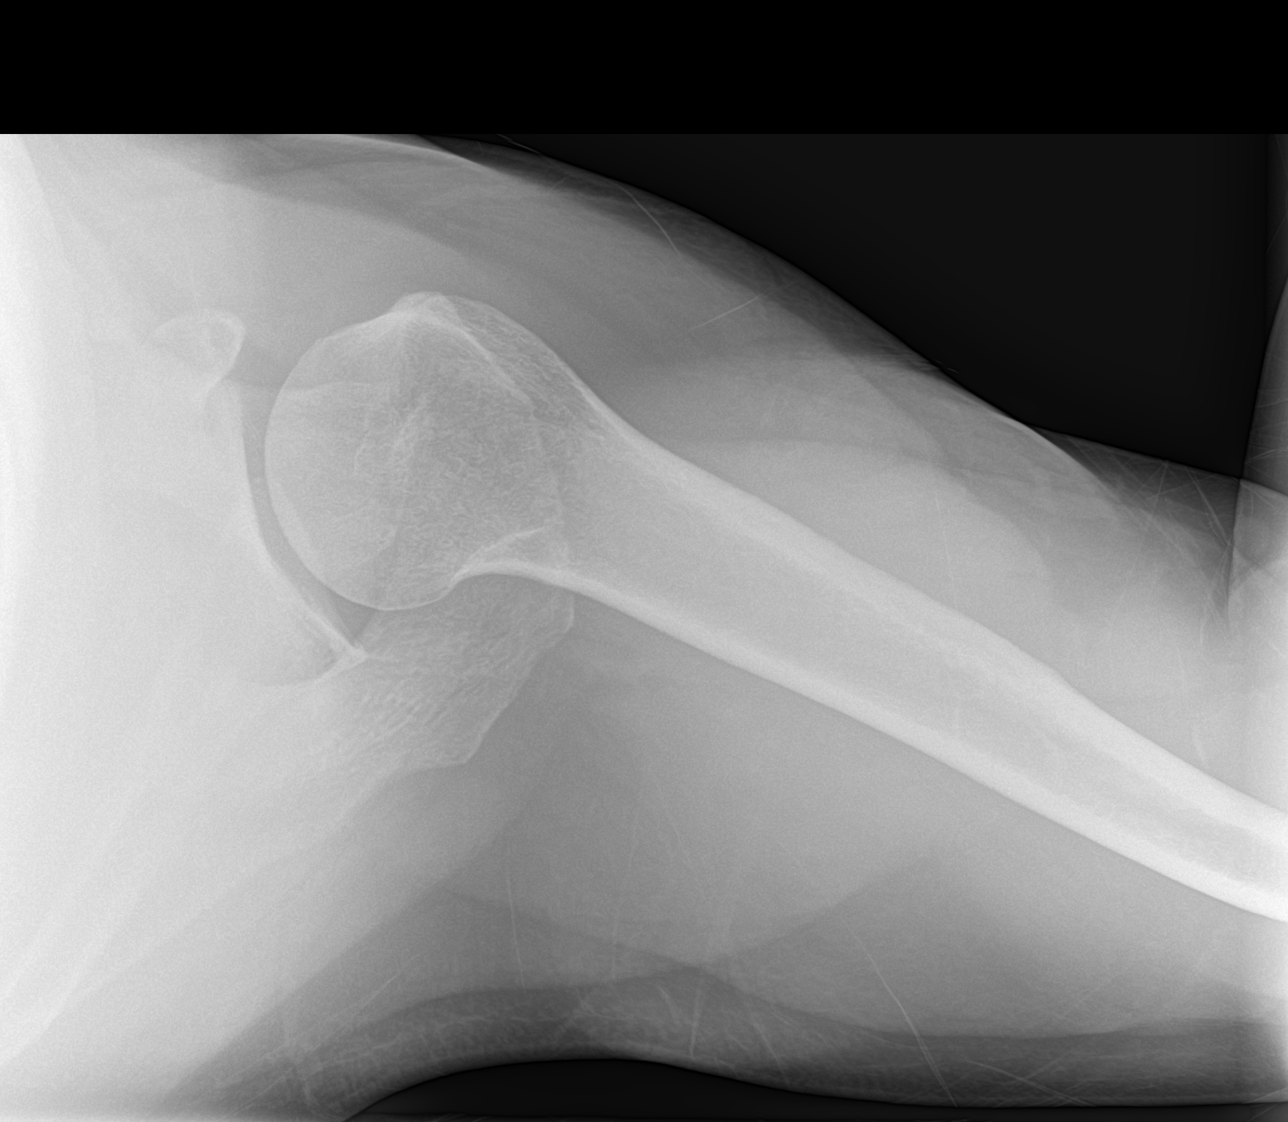

[3 of 3 positions shown; findings below may reference images not displayed]

FINDINGS: Joint space narrowing within the AC joint. Glenohumeral joint is
maintained. No acute bony abnormality. Specifically, no fracture,
subluxation, or dislocation.
IMPRESSION: Early osteoarthritis in the right AC joint. No acute bony
abnormality.

## 2022-03-28 DIAGNOSIS — X32XXXA Exposure to sunlight, initial encounter: Secondary | ICD-10-CM | POA: Diagnosis not present

## 2022-03-28 DIAGNOSIS — Z85828 Personal history of other malignant neoplasm of skin: Secondary | ICD-10-CM | POA: Diagnosis not present

## 2022-03-28 DIAGNOSIS — L570 Actinic keratosis: Secondary | ICD-10-CM | POA: Diagnosis not present

## 2022-03-28 DIAGNOSIS — D225 Melanocytic nevi of trunk: Secondary | ICD-10-CM | POA: Diagnosis not present

## 2022-07-31 ENCOUNTER — Ambulatory Visit (INDEPENDENT_AMBULATORY_CARE_PROVIDER_SITE_OTHER): Payer: BC Managed Care – PPO

## 2022-07-31 DIAGNOSIS — Z23 Encounter for immunization: Secondary | ICD-10-CM

## 2022-07-31 NOTE — Progress Notes (Signed)
Per orders of Dr. Lorelei Pont, in absence of Dr. Silvio Pate, 1st injection of shingrix given by Loreen Freud. Patient tolerated injection well.

## 2022-09-23 ENCOUNTER — Encounter: Payer: Self-pay | Admitting: Internal Medicine

## 2022-09-23 ENCOUNTER — Ambulatory Visit (INDEPENDENT_AMBULATORY_CARE_PROVIDER_SITE_OTHER): Payer: BC Managed Care – PPO | Admitting: Internal Medicine

## 2022-09-23 VITALS — BP 112/74 | HR 66 | Temp 97.1°F | Ht 73.25 in | Wt 242.0 lb

## 2022-09-23 DIAGNOSIS — R200 Anesthesia of skin: Secondary | ICD-10-CM | POA: Diagnosis not present

## 2022-09-23 DIAGNOSIS — E7800 Pure hypercholesterolemia, unspecified: Secondary | ICD-10-CM

## 2022-09-23 NOTE — Progress Notes (Signed)
   Subjective:    Patient ID: Ethan Aguilar., male    DOB: 10-28-1971, 51 y.o.   MRN: [id number]  HPI Here for physical  Too early though Did gain a few pounds Slacked off on the gym somewhat  Has had some hand tingling in bed---distal tingling May be during the day---still just with sitting around  No current outpatient medications on file prior to visit.   No current facility-administered medications on file prior to visit.    Allergies  Allergen Reactions   Nsaids Hives   Penicillins     REACTION: rash----not sure, had something as an infant    Past Medical History:  Diagnosis Date   Heart murmur    Hx of adenomatous polyp of colon 12/08/2019    Past Surgical History:  Procedure Laterality Date   TONSILLECTOMY      Family History  Problem Relation Age of Onset   Cataracts Mother    Glaucoma Mother    Hypertension Mother    Cataracts Father    Diabetes Father 32   Hypertension Father    Tremor Sister    Mental illness Maternal Grandmother    Lung cancer Maternal Grandfather    Stomach cancer Maternal Grandfather    Arthritis Paternal Grandmother    Varicose Veins Paternal Grandmother    Heart attack Paternal Grandfather    Prostate cancer Paternal Grandfather    Colon cancer Neg Hx    Colon polyps Neg Hx    Esophageal cancer Neg Hx    Rectal cancer Neg Hx     Social History   Socioeconomic History   Marital status: Married    Spouse name: Not on file   Number of children: 2   Years of education: Not on file   Highest education level: Not on file  Occupational History   Occupation: Network engineer    Employer: [name]  Tobacco Use   Smoking status: Former    Passive exposure: Past (small child)   Smokeless tobacco: Former    Types: Snuff    Quit date: 12/09/2012   Tobacco comments:    Only smoked during test time in college  Vaping Use   Vaping Use: Never used  Substance and Sexual Activity   Alcohol use: Yes     Alcohol/week: 1.0 standard drink of alcohol    Types: 1 Standard drinks or equivalent per week    Comment: RARELY   Drug use: Not Currently   Sexual activity: Not on file  Other Topics Concern   Not on file  Social History Narrative   Not on file   Social Determinants of Health   Financial Resource Strain: Not on file  Food Insecurity: Not on file  Transportation Needs: Not on file  Physical Activity: Not on file  Stress: Not on file  Social Connections: Not on file  Intimate Partner Violence: Not on file   Review of Systems     Objective:   Physical Exam Constitutional:      Appearance: Normal appearance.  Musculoskeletal:     Comments: Hands are warm and with normal pulses  Neurological:     Mental Status: He is alert.            Assessment & Plan:

## 2022-09-23 NOTE — Assessment & Plan Note (Signed)
Was 207 HDL 45 Discussed fitness and working on healthy eating Will recheck

## 2022-09-23 NOTE — Assessment & Plan Note (Signed)
Some abnormal sensation in fingers Mostly in bed---thinks it could be temperature driven Not CTS---can occur when playing golf also-----and not upon awakening (and all fingertips) Will check some labs

## 2022-09-24 LAB — LIPID PANEL
Cholesterol: 222 mg/dL — ABNORMAL HIGH (ref 0–200)
HDL: 51 mg/dL (ref >39.00)
LDL Cholesterol: 145 mg/dL — ABNORMAL HIGH (ref 0–99)
NonHDL: 170.55
Total CHOL/HDL Ratio: 4
Triglycerides: 126 mg/dL (ref 0.0–149.0)
VLDL: 25.2 mg/dL (ref 0.0–40.0)

## 2022-09-24 LAB — COMPREHENSIVE METABOLIC PANEL
ALT: 21 U/L (ref 0–53)
AST: 22 U/L (ref 0–37)
Albumin: 4.6 g/dL (ref 3.5–5.2)
Alkaline Phosphatase: 63 U/L (ref 39–117)
BUN: 19 mg/dL (ref 6–23)
CO2: 28 mEq/L (ref 19–32)
Calcium: 9.4 mg/dL (ref 8.4–10.5)
Chloride: 100 mEq/L (ref 96–112)
Creatinine, Ser: 1.3 mg/dL (ref 0.40–1.50)
GFR: 63.03 mL/min (ref >60.00)
Glucose, Bld: 90 mg/dL (ref 70–99)
Potassium: 4 mEq/L (ref 3.5–5.1)
Sodium: 137 mEq/L (ref 135–145)
Total Bilirubin: 0.7 mg/dL (ref 0.2–1.2)
Total Protein: 6.9 g/dL (ref 6.0–8.3)

## 2022-09-24 LAB — CBC
HCT: 43.9 % (ref 39.0–52.0)
Hemoglobin: 14.6 g/dL (ref 13.0–17.0)
MCHC: 33.4 g/dL (ref 30.0–36.0)
MCV: 87.6 fl (ref 78.0–100.0)
Platelets: 248 10*3/uL (ref 150.0–400.0)
RBC: 5.01 Mil/uL (ref 4.22–5.81)
RDW: 13.6 % (ref 11.5–15.5)
WBC: 7 10*3/uL (ref 4.0–10.5)

## 2022-09-24 LAB — TSH: TSH: 1.86 u[IU]/mL (ref 0.35–5.50)

## 2022-09-24 LAB — VITAMIN B12: Vitamin B-12: 216 pg/mL (ref 211–911)

## 2022-11-18 ENCOUNTER — Encounter: Payer: Self-pay | Admitting: Internal Medicine

## 2022-11-18 ENCOUNTER — Ambulatory Visit (INDEPENDENT_AMBULATORY_CARE_PROVIDER_SITE_OTHER): Payer: BC Managed Care – PPO | Admitting: Internal Medicine

## 2022-11-18 VITALS — BP 124/80 | HR 76 | Temp 97.7°F | Ht 71.5 in | Wt 249.0 lb

## 2022-11-18 DIAGNOSIS — Z0000 Encounter for general adult medical examination without abnormal findings: Secondary | ICD-10-CM

## 2022-11-18 DIAGNOSIS — E7800 Pure hypercholesterolemia, unspecified: Secondary | ICD-10-CM | POA: Diagnosis not present

## 2022-11-18 DIAGNOSIS — Z23 Encounter for immunization: Secondary | ICD-10-CM

## 2022-11-18 DIAGNOSIS — R200 Anesthesia of skin: Secondary | ICD-10-CM

## 2022-11-18 NOTE — Assessment & Plan Note (Signed)
No Rx for now

## 2022-11-18 NOTE — Assessment & Plan Note (Signed)
No progression Discussed giving a one month try for the B12

## 2022-11-18 NOTE — Progress Notes (Signed)
Subjective:    Patient ID: Ethan Riding., male    DOB: 1971/02/22, 51 y.o.   MRN: [id number]  HPI Here for physical  No concerns Same job Is going to gym fairly regularly again  No current outpatient medications on file prior to visit.   No current facility-administered medications on file prior to visit.    Allergies  Allergen Reactions   Nsaids Hives   Penicillins     REACTION: rash----not sure, had something as an infant    Past Medical History:  Diagnosis Date   Heart murmur    Hx of adenomatous polyp of colon 12/08/2019    Past Surgical History:  Procedure Laterality Date   TONSILLECTOMY      Family History  Problem Relation Age of Onset   Cataracts Mother    Glaucoma Mother    Hypertension Mother    Cataracts Father    Diabetes Father 26   Hypertension Father    Tremor Sister    Mental illness Maternal Grandmother    Lung cancer Maternal Grandfather    Stomach cancer Maternal Grandfather    Arthritis Paternal Grandmother    Varicose Veins Paternal Grandmother    Heart attack Paternal Grandfather    Prostate cancer Paternal Grandfather    Colon cancer Neg Hx    Colon polyps Neg Hx    Esophageal cancer Neg Hx    Rectal cancer Neg Hx     Social History   Socioeconomic History   Marital status: Married    Spouse name: Not on file   Number of children: 2   Years of education: Not on file   Highest education level: Not on file  Occupational History   Occupation: Network engineer    Employer: [name]  Tobacco Use   Smoking status: Former    Passive exposure: Past (small child)   Smokeless tobacco: Former    Types: Snuff    Quit date: 12/09/2012   Tobacco comments:    Only smoked during test time in college  Vaping Use   Vaping Use: Never used  Substance and Sexual Activity   Alcohol use: Yes    Alcohol/week: 1.0 standard drink of alcohol    Types: 1 Standard drinks or equivalent per week    Comment: RARELY   Drug use: Not  Currently   Sexual activity: Not on file  Other Topics Concern   Not on file  Social History Narrative   Not on file   Social Determinants of Health   Financial Resource Strain: Not on file  Food Insecurity: Not on file  Transportation Needs: Not on file  Physical Activity: Not on file  Stress: Not on file  Social Connections: Not on file  Intimate Partner Violence: Not on file   Review of Systems  Constitutional:  Negative for fatigue and unexpected weight change.       Wears seat belt  HENT:  Negative for dental problem, hearing loss and tinnitus.        Keeps up with dentist  Eyes:  Negative for visual disturbance.       No diplopia or unilateral vision loss  Respiratory:  Negative for cough, chest tightness and shortness of breath.   Cardiovascular:  Negative for chest pain, palpitations and leg swelling.  Gastrointestinal:  Negative for blood in stool and constipation.       No heartburn  Endocrine: Negative for polydipsia and polyuria.  Genitourinary:  Negative for urgency.  Very slight dribbling No sexual problems  Musculoskeletal:  Negative for arthralgias, back pain and joint swelling.  Skin:  Negative for rash.  Allergic/Immunologic: Negative for immunocompromised state.       Minor symptoms--rare meds  Neurological:  Negative for dizziness, syncope, light-headedness and headaches.  Hematological:  Negative for adenopathy. Does not bruise/bleed easily.  Psychiatric/Behavioral:  Negative for dysphoric mood and sleep disturbance. The patient is not nervous/anxious.        Objective:   Physical Exam Constitutional:      Appearance: Normal appearance.  HENT:     Mouth/Throat:     Pharynx: No oropharyngeal exudate or posterior oropharyngeal erythema.  Eyes:     Conjunctiva/sclera: Conjunctivae normal.     Pupils: Pupils are equal, round, and reactive to light.  Cardiovascular:     Rate and Rhythm: Normal rate and regular rhythm.     Pulses: Normal  pulses.     Heart sounds: No murmur heard.    No gallop.  Pulmonary:     Effort: Pulmonary effort is normal.     Breath sounds: Normal breath sounds. No wheezing or rales.  Abdominal:     Palpations: Abdomen is soft.     Tenderness: There is no abdominal tenderness.  Musculoskeletal:     Cervical back: Neck supple.     Right lower leg: No edema.     Left lower leg: No edema.  Lymphadenopathy:     Cervical: No cervical adenopathy.  Skin:    Findings: No lesion or rash.  Neurological:     General: No focal deficit present.     Mental Status: He is alert and oriented to person, place, and time.  Psychiatric:        Mood and Affect: Mood normal.        Behavior: Behavior normal.            Assessment & Plan:

## 2022-11-18 NOTE — Addendum Note (Signed)
Addended by: Pilar Grammes on: 11/18/2022 04:15 PM   Modules accepted: Orders

## 2022-11-18 NOTE — Assessment & Plan Note (Signed)
Healthy Back to exercising Colon due 2027 Consider PSA testing at 44 Had flu vaccine Updated COVID at pharmacy Shingrix #2 today

## 2023-04-03 DIAGNOSIS — L570 Actinic keratosis: Secondary | ICD-10-CM | POA: Diagnosis not present

## 2023-04-03 DIAGNOSIS — D2261 Melanocytic nevi of right upper limb, including shoulder: Secondary | ICD-10-CM | POA: Diagnosis not present

## 2023-04-03 DIAGNOSIS — D2262 Melanocytic nevi of left upper limb, including shoulder: Secondary | ICD-10-CM | POA: Diagnosis not present

## 2023-04-03 DIAGNOSIS — Z85828 Personal history of other malignant neoplasm of skin: Secondary | ICD-10-CM | POA: Diagnosis not present

## 2023-04-03 DIAGNOSIS — X32XXXA Exposure to sunlight, initial encounter: Secondary | ICD-10-CM | POA: Diagnosis not present

## 2023-04-03 DIAGNOSIS — D225 Melanocytic nevi of trunk: Secondary | ICD-10-CM | POA: Diagnosis not present

## 2023-05-16 ENCOUNTER — Ambulatory Visit
Admission: RE | Admit: 2023-05-16 | Discharge: 2023-05-16 | Disposition: A | Discharge status: Home / Self Care | Payer: BC Managed Care – PPO | Source: Ambulatory Visit | Attending: Emergency Medicine | Admitting: Emergency Medicine

## 2023-05-16 VITALS — BP 144/93 | HR 67 | Temp 98.1°F | Resp 16

## 2023-05-16 DIAGNOSIS — J069 Acute upper respiratory infection, unspecified: Secondary | ICD-10-CM | POA: Diagnosis not present

## 2023-05-16 MED ORDER — AZITHROMYCIN 250 MG PO TABS: 250.0000 mg | ORAL_TABLET | Freq: Every day | ORAL | 0 refills | Status: DC

## 2023-05-16 NOTE — ED Provider Notes (Signed)
Ethan Aguilar    CSN: [id number] Arrival date & time: 05/16/23  1337      History   Chief Complaint Chief Complaint  Patient presents with   Cough    Possible sinus infection. Persistent cough with phlegm. - Entered by patient    HPI Ethan Aguilar. is a 52 y.o. male.   Patient presents for evaluation of nasal congestion, rhinorrhea, sinus pressure, chest congestion and a productive cough present for 7 days.  Feels as if there is mucus sitting within the chest and the throat that he is not fully able to move.  Sinus pressure occurring intermittently but denies presence of pain.  Has attempted use of pseudoephedrine and NyQuil.  No known sick contacts prior.  Tolerating food and liquids.  Denies fevers, shortness of breath, wheeze.  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 Authorizing Provider  azithromycin (ZITHROMAX) 250 MG tablet Take 1 tablet (250 mg total) by mouth daily. Take first 2 tablets together, then 1 every day until finished. 05/16/23  Yes Hiep Ollis, Elita Boone, NP    Family History Family History  Problem Relation Age of Onset   Cataracts Mother    Glaucoma Mother    Hypertension Mother    Cataracts Father    Diabetes Father 56   Hypertension Father    Tremor Sister    Mental illness Maternal Grandmother    Lung cancer Maternal Grandfather    Stomach cancer Maternal Grandfather    Arthritis Paternal Grandmother    Varicose Veins Paternal Grandmother    Heart attack Paternal Grandfather    Prostate cancer  Paternal Grandfather    Colon cancer Neg Hx    Colon polyps Neg Hx    Esophageal cancer Neg Hx    Rectal cancer Neg Hx     Social History Social History   Tobacco Use   Smoking status: Former    Passive exposure: Past (small child)   Smokeless tobacco: Former    Types: Snuff    Quit date: 12/09/2012   Tobacco comments:    Only smoked during test time in college  Vaping Use   Vaping Use: Never used  Substance Use Topics   Alcohol use: Yes    Alcohol/week: 1.0 standard drink of alcohol    Types: 1 Standard drinks or equivalent per week    Comment: RARELY   Drug use: Not Currently     Allergies   Nsaids and Penicillins   Review of Systems Review of Systems  Respiratory:  Positive for cough.      Physical Exam Triage Vital Signs ED Triage Vitals  Enc Vitals Group     BP 05/16/23 1424 (!) 144/93     Pulse Rate 05/16/23 1424 67     Resp 05/16/23 1424 16     Temp 05/16/23 1424 98.1 F (36.7 C)     Temp Source 05/16/23 1424 Oral     SpO2 05/16/23 1424 96 %  Weight --      Height --      Head Circumference --      Peak Flow --      Pain Score 05/16/23 1426 0     Pain Loc --      Pain Edu? --      Excl. in GC? --    No data found.  Updated Vital Signs BP (!) 144/93 (BP Location: Left Arm)   Pulse 67   Temp 98.1 F (36.7 C) (Oral)   Resp 16   SpO2 96%   Visual Acuity Right Eye Distance:   Left Eye Distance:   Bilateral Distance:    Right Eye Near:   Left Eye Near:    Bilateral Near:     Physical Exam Constitutional:      Appearance: Normal appearance.  HENT:     Right Ear: Tympanic membrane, ear canal and external ear normal.     Left Ear: Tympanic membrane, ear canal and external ear normal.     Nose: Congestion present. No rhinorrhea.     Right Sinus: No maxillary sinus tenderness or frontal sinus tenderness.     Left Sinus: No maxillary sinus tenderness or frontal sinus tenderness.     Mouth/Throat:     Mouth: Mucous membranes are  moist.     Pharynx: Oropharynx is clear. No posterior oropharyngeal erythema.  Eyes:     Extraocular Movements: Extraocular movements intact.  Cardiovascular:     Rate and Rhythm: Normal rate and regular rhythm.     Pulses: Normal pulses.     Heart sounds: Normal heart sounds.  Pulmonary:     Effort: Pulmonary effort is normal.     Breath sounds: Normal breath sounds.  Neurological:     Mental Status: He is alert and oriented to person, place, and time. Mental status is at baseline.      UC Treatments / Results  Labs (all labs ordered are listed, but only abnormal results are displayed) Labs Reviewed - No data to display  EKG   Radiology No results found.  Procedures Procedures (including critical care time)  Medications Ordered in UC Medications - No data to display  Initial Impression / Assessment and Plan / UC Course  I have reviewed the triage vital signs and the nursing notes.  Pertinent labs & imaging results that were available during my care of the patient were reviewed by me and considered in my medical decision making (see chart for details).  Acute URI  Patient is in no signs of distress nor toxic appearing.  Vital signs are stable.  Low suspicion for pneumonia, pneumothorax or bronchitis and therefore will defer imaging.  Providing bacterial coverage prophylactically as symptoms have been present for 7 days without signs of resolution, prescribed azithromycin.May use additional over-the-counter medications as needed for supportive care.  May follow-up with urgent care as needed if symptoms persist or worsen.   Final Clinical Impressions(s) / UC Diagnoses   Final diagnoses:  Acute URI     Discharge Instructions      Take azithromycin as directed to cover for bacteria which may be causing her symptoms to prolong    You can take Tylenol and/or Ibuprofen as needed for fever reduction and pain relief.   For cough: honey 1/2 to 1 teaspoon (you can dilute  the honey in water or another fluid).  You can also use guaifenesin and dextromethorphan for cough. You can use a humidifier for chest congestion and cough.  If you don't have a humidifier, you can sit in the bathroom with the hot shower running.      For sore throat: try warm salt water gargles, cepacol lozenges, throat spray, warm tea or water with lemon/honey, popsicles or ice, or OTC cold relief medicine for throat discomfort.   For congestion: take a daily anti-histamine like Zyrtec, Claritin, and a oral decongestant, such as pseudoephedrine.  You can also use Flonase 1-2 sprays in each nostril daily.   It is important to stay hydrated: drink plenty of fluids (water, gatorade/powerade/pedialyte, juices, or teas) to keep your throat moisturized and help further relieve irritation/discomfort.    ED Prescriptions     Medication Sig Dispense Auth. Provider   azithromycin (ZITHROMAX) 250 MG tablet Take 1 tablet (250 mg total) by mouth daily. Take first 2 tablets together, then 1 every day until finished. 6 tablet Valinda Hoar, NP      PDMP not reviewed this encounter.   Valinda Hoar, NP 05/16/23 1551

## 2023-05-16 NOTE — ED Triage Notes (Signed)
Patient presents to UC for productive cough since last Friday. Took sudafed this morning. Concerned with sinus infection.

## 2023-05-16 NOTE — Discharge Instructions (Signed)
Take azithromycin as directed to cover for bacteria which may be causing her symptoms to prolong    You can take Tylenol and/or Ibuprofen as needed for fever reduction and pain relief.   For cough: honey 1/2 to 1 teaspoon (you can dilute the honey in water or another fluid).  You can also use guaifenesin and dextromethorphan for cough. You can use a humidifier for chest congestion and cough.  If you don't have a humidifier, you can sit in the bathroom with the hot shower running.      For sore throat: try warm salt water gargles, cepacol lozenges, throat spray, warm tea or water with lemon/honey, popsicles or ice, or OTC cold relief medicine for throat discomfort.   For congestion: take a daily anti-histamine like Zyrtec, Claritin, and a oral decongestant, such as pseudoephedrine.  You can also use Flonase 1-2 sprays in each nostril daily.   It is important to stay hydrated: drink plenty of fluids (water, gatorade/powerade/pedialyte, juices, or teas) to keep your throat moisturized and help further relieve irritation/discomfort.

## 2023-11-20 ENCOUNTER — Ambulatory Visit: Payer: BC Managed Care – PPO | Admitting: Internal Medicine

## 2023-11-20 ENCOUNTER — Encounter: Payer: Self-pay | Admitting: Internal Medicine

## 2023-11-20 VITALS — BP 116/78 | HR 82 | Temp 98.4°F | Ht 72.5 in | Wt 249.0 lb

## 2023-11-20 DIAGNOSIS — Z23 Encounter for immunization: Secondary | ICD-10-CM | POA: Diagnosis not present

## 2023-11-20 DIAGNOSIS — Z0000 Encounter for general adult medical examination without abnormal findings: Secondary | ICD-10-CM

## 2023-11-20 DIAGNOSIS — Z125 Encounter for screening for malignant neoplasm of prostate: Secondary | ICD-10-CM | POA: Diagnosis not present

## 2023-11-20 DIAGNOSIS — E7800 Pure hypercholesterolemia, unspecified: Secondary | ICD-10-CM | POA: Diagnosis not present

## 2023-11-20 LAB — HM HEPATITIS C SCREENING LAB: HM Hepatitis Screen: NEGATIVE

## 2023-11-20 NOTE — Assessment & Plan Note (Signed)
Healthy  Back to the gym Flu vaccine today COVID at pharmacy Colon due 2028 Discussed PSA --will check

## 2023-11-20 NOTE — Patient Instructions (Signed)
Please try over the counter diclofenac gel on your elbow if things get worse

## 2023-11-20 NOTE — Assessment & Plan Note (Signed)
Mild  Low risk Doesn't want statin at this point

## 2023-11-20 NOTE — Progress Notes (Signed)
Subjective:    Patient ID: Ethan Aguilar., male    DOB: 29-Jun-1971, 52 y.o.   MRN: [id number]  HPI Here for physical  Doing well Has noticed some right elbow pain---thinks it may be tendonitis Dull ache constantly--but not bad. Feels it when putting pressure below the elbow Mild symptoms with movement No swelling  Has dry spot behind left ear--wants checked (does see derm)  Still goes to gym---did have a 6 month hiatus---but is back to it in the past month  No current outpatient medications on file prior to visit.   No current facility-administered medications on file prior to visit.    Allergies  Allergen Reactions   Nsaids Hives   Penicillins     REACTION: rash----not sure, had something as an infant    Past Medical History:  Diagnosis Date   Heart murmur    Hx of adenomatous polyp of colon 12/08/2019    Past Surgical History:  Procedure Laterality Date   TONSILLECTOMY      Family History  Problem Relation Age of Onset   Cataracts Mother    Glaucoma Mother    Hypertension Mother    Cataracts Father    Diabetes Father 21   Hypertension Father    Tremor Sister    Mental illness Maternal Grandmother    Lung cancer Maternal Grandfather    Stomach cancer Maternal Grandfather    Arthritis Paternal Grandmother    Varicose Veins Paternal Grandmother    Heart attack Paternal Grandfather    Prostate cancer Paternal Grandfather    Colon cancer Neg Hx    Colon polyps Neg Hx    Esophageal cancer Neg Hx    Rectal cancer Neg Hx     Social History   Socioeconomic History   Marital status: Married    Spouse name: Not on file   Number of children: 2   Years of education: Not on file   Highest education level: Not on file  Occupational History   Occupation: Forensic scientist    Employer: [employer]  Tobacco Use   Smoking status: Former    Passive exposure: Past (small child)   Smokeless tobacco: Former    Types: Snuff    Quit date: 12/09/2012    Tobacco comments:    Only smoked during test time in college  Vaping Use   Vaping status: Never Used  Substance and Sexual Activity   Alcohol use: Yes    Alcohol/week: 1.0 standard drink of alcohol    Types: 1 Standard drinks or equivalent per week    Comment: RARELY   Drug use: Not Currently   Sexual activity: Not on file  Other Topics Concern   Not on file  Social History Narrative   Not on file   Social Drivers of Health   Financial Resource Strain: Not on file  Food Insecurity: Not on file  Transportation Needs: Not on file  Physical Activity: Not on file  Stress: Not on file  Social Connections: Not on file  Intimate Partner Violence: Not on file   Review of Systems  Constitutional:  Negative for fatigue and unexpected weight change.       Wears seat belt  HENT:  Negative for dental problem, hearing loss and tinnitus.        Keeps up with dentist  Eyes:  Negative for visual disturbance.       No diplopia or unilateral vision loss  Respiratory:  Negative for cough, chest tightness and  shortness of breath.   Cardiovascular:  Negative for chest pain, palpitations and leg swelling.  Gastrointestinal:  Negative for blood in stool and constipation.       No heartburn  Endocrine: Negative for polydipsia and polyuria.  Genitourinary:  Negative for difficulty urinating and urgency.       No sig ED  Musculoskeletal:  Positive for arthralgias. Negative for back pain and joint swelling.       Does have  back "pop out"---- rolls on floor and wife will step on him with relief Gets massage every other week  Skin:  Negative for rash.  Allergic/Immunologic: Negative for environmental allergies and immunocompromised state.  Neurological:  Negative for dizziness, syncope, light-headedness and headaches.  Hematological:  Negative for adenopathy. Does not bruise/bleed easily.  Psychiatric/Behavioral:  Negative for dysphoric mood and sleep disturbance. The patient is not  nervous/anxious.        Objective:   Physical Exam Constitutional:      Appearance: Normal appearance.  HENT:     Mouth/Throat:     Pharynx: No oropharyngeal exudate or posterior oropharyngeal erythema.  Eyes:     Conjunctiva/sclera: Conjunctivae normal.     Pupils: Pupils are equal, round, and reactive to light.  Cardiovascular:     Rate and Rhythm: Normal rate and regular rhythm.     Pulses: Normal pulses.     Heart sounds: No murmur heard.    No gallop.  Pulmonary:     Effort: Pulmonary effort is normal.     Breath sounds: Normal breath sounds. No wheezing or rales.  Abdominal:     Palpations: Abdomen is soft.     Tenderness: There is no abdominal tenderness.  Musculoskeletal:     Cervical back: Neck supple.     Right lower leg: No edema.     Left lower leg: No edema.     Comments: Slight tenderness over medial condyle right elbow  Lymphadenopathy:     Cervical: No cervical adenopathy.  Skin:    Findings: No rash.     Comments: Small scaly area left post auricular--no clear inflammation (may need cryotherapy by the time he sees derm)  Neurological:     General: No focal deficit present.     Mental Status: He is alert and oriented to person, place, and time.  Psychiatric:        Mood and Affect: Mood normal.        Behavior: Behavior normal.            Assessment & Plan:

## 2023-11-20 NOTE — Addendum Note (Signed)
Addended by: Eual Fines on: 11/20/2023 03:45 PM   Modules accepted: Orders

## 2023-11-21 LAB — LIPID PANEL
Cholesterol: 237 mg/dL — ABNORMAL HIGH (ref 0–200)
HDL: 48.8 mg/dL (ref >39.00)
LDL Cholesterol: 165 mg/dL — ABNORMAL HIGH (ref 0–99)
NonHDL: 188.61
Total CHOL/HDL Ratio: 5
Triglycerides: 118 mg/dL (ref 0.0–149.0)
VLDL: 23.6 mg/dL (ref 0.0–40.0)

## 2023-11-21 LAB — CBC
HCT: 43.7 % (ref 39.0–52.0)
Hemoglobin: 14.9 g/dL (ref 13.0–17.0)
MCHC: 34 g/dL (ref 30.0–36.0)
MCV: 88.4 fL (ref 78.0–100.0)
Platelets: 255 10*3/uL (ref 150.0–400.0)
RBC: 4.95 Mil/uL (ref 4.22–5.81)
RDW: 14 % (ref 11.5–15.5)
WBC: 7.3 10*3/uL (ref 4.0–10.5)

## 2023-11-21 LAB — COMPREHENSIVE METABOLIC PANEL
ALT: 25 U/L (ref 0–53)
AST: 19 U/L (ref 0–37)
Albumin: 4.7 g/dL (ref 3.5–5.2)
Alkaline Phosphatase: 63 U/L (ref 39–117)
BUN: 13 mg/dL (ref 6–23)
CO2: 29 meq/L (ref 19–32)
Calcium: 9.1 mg/dL (ref 8.4–10.5)
Chloride: 100 meq/L (ref 96–112)
Creatinine, Ser: 1.12 mg/dL (ref 0.40–1.50)
GFR: 74.77 mL/min (ref >60.00)
Glucose, Bld: 83 mg/dL (ref 70–99)
Potassium: 4 meq/L (ref 3.5–5.1)
Sodium: 139 meq/L (ref 135–145)
Total Bilirubin: 0.7 mg/dL (ref 0.2–1.2)
Total Protein: 7 g/dL (ref 6.0–8.3)

## 2023-11-21 LAB — PSA: PSA: 1.71 ng/mL (ref 0.10–4.00)

## 2024-04-08 DIAGNOSIS — D2272 Melanocytic nevi of left lower limb, including hip: Secondary | ICD-10-CM | POA: Diagnosis not present

## 2024-04-08 DIAGNOSIS — D2271 Melanocytic nevi of right lower limb, including hip: Secondary | ICD-10-CM | POA: Diagnosis not present

## 2024-04-08 DIAGNOSIS — D485 Neoplasm of uncertain behavior of skin: Secondary | ICD-10-CM | POA: Diagnosis not present

## 2024-04-08 DIAGNOSIS — D2261 Melanocytic nevi of right upper limb, including shoulder: Secondary | ICD-10-CM | POA: Diagnosis not present

## 2024-04-08 DIAGNOSIS — D2262 Melanocytic nevi of left upper limb, including shoulder: Secondary | ICD-10-CM | POA: Diagnosis not present

## 2024-04-08 DIAGNOSIS — L570 Actinic keratosis: Secondary | ICD-10-CM | POA: Diagnosis not present

## 2024-04-08 DIAGNOSIS — C44719 Basal cell carcinoma of skin of left lower limb, including hip: Secondary | ICD-10-CM | POA: Diagnosis not present

## 2024-05-17 DIAGNOSIS — C44719 Basal cell carcinoma of skin of left lower limb, including hip: Secondary | ICD-10-CM | POA: Diagnosis not present

## 2024-11-19 ENCOUNTER — Ambulatory Visit: Payer: BC Managed Care – PPO | Admitting: Nurse Practitioner

## 2024-11-19 ENCOUNTER — Encounter: Payer: Self-pay | Admitting: Nurse Practitioner

## 2024-11-19 VITALS — BP 128/70 | HR 76 | Temp 98.0°F | Ht 73.0 in | Wt 246.4 lb

## 2024-11-19 DIAGNOSIS — E7800 Pure hypercholesterolemia, unspecified: Secondary | ICD-10-CM

## 2024-11-19 DIAGNOSIS — E669 Obesity, unspecified: Secondary | ICD-10-CM | POA: Diagnosis not present

## 2024-11-19 DIAGNOSIS — Z0000 Encounter for general adult medical examination without abnormal findings: Secondary | ICD-10-CM

## 2024-11-19 DIAGNOSIS — Z131 Encounter for screening for diabetes mellitus: Secondary | ICD-10-CM

## 2024-11-19 DIAGNOSIS — Z23 Encounter for immunization: Secondary | ICD-10-CM | POA: Diagnosis not present

## 2024-11-19 DIAGNOSIS — Z125 Encounter for screening for malignant neoplasm of prostate: Secondary | ICD-10-CM | POA: Diagnosis not present

## 2024-11-19 NOTE — Progress Notes (Signed)
 Established Patient Office Visit  Subjective   Patient ID: Ethan Aguilar., male    DOB: [birth year]  Age: 53 y.o. MRN: [id number]  Chief Complaint  Patient presents with   Annual Exam    HPI   Basal cell carcinoma: Patient currently followed by Dr. Alm Lien  for complete physical and follow up of chronic conditions.  Immunizations: -Tetanus: Completed in 2016 -Influenza: decloned -Shingles: Completed Shingrix  series -Pneumonia: up date today   Diet: Fair diet. He is eating 2 meals a day and typically skips over breakfast. He does not snacks. He dirnks coffee, sweet tea, use to do zero sugar sodas but has stopped. He does drink water  Exercise: No regular exercise. Over the past few weeks he has not . He will do 18 holes of golf 3 times a month and 4 miles daily.  Eye exam: Completes annually.  Dental exam: Completes semi-annually    Colonoscopy: Completed in 11/24/2019, repeat in 8 years.  Patient due 2028 Lung Cancer Screening: NA   PSA: Due  Sleep: goes to bed 1030 and gets up at 6. He feels rested. States that he will hit a wall around 130-2. Does snore.  Not apparent daytime sleepiness      Review of Systems  Constitutional:  Negative for chills and fever.  Respiratory:  Negative for shortness of breath.   Cardiovascular:  Negative for chest pain and leg swelling.  Gastrointestinal:  Negative for abdominal pain, blood in stool, constipation, diarrhea, nausea and vomiting.       BM daily   Genitourinary:  Negative for dysuria and hematuria.       Nocturia x 0  Neurological:  Negative for dizziness, tingling and headaches.  Psychiatric/Behavioral:  Negative for hallucinations and suicidal ideas.       Objective:     BP 128/70   Pulse 76   Temp 98 F (36.7 C) (Oral)   Ht 6' 1 (1.854 m)   Wt 246 lb 6.4 oz (111.8 kg)   SpO2 99%   BMI 32.51 kg/m  BP Readings from Last 3 Encounters:  11/19/24 128/70  11/20/23 116/78  05/16/23 (!) 144/93    Wt Readings from Last 3 Encounters:  11/19/24 246 lb 6.4 oz (111.8 kg)  11/20/23 249 lb (112.9 kg)  11/18/22 249 lb (112.9 kg)   SpO2 Readings from Last 3 Encounters:  11/19/24 99%  11/20/23 98%  05/16/23 96%      Physical Exam Vitals and nursing note reviewed.  Constitutional:      Appearance: Normal appearance.  HENT:     Right Ear: Tympanic membrane, ear canal and external ear normal.     Left Ear: Tympanic membrane, ear canal and external ear normal.     Mouth/Throat:     Mouth: Mucous membranes are moist.     Pharynx: Oropharynx is clear.  Eyes:     Extraocular Movements: Extraocular movements intact.     Pupils: Pupils are equal, round, and reactive to light.  Cardiovascular:     Rate and Rhythm: Normal rate and regular rhythm.     Pulses: Normal pulses.     Heart sounds: Normal heart sounds.  Pulmonary:     Effort: Pulmonary effort is normal.     Breath sounds: Normal breath sounds.  Abdominal:     General: Bowel sounds are normal. There is no distension.     Palpations: There is no mass.     Tenderness: There is no abdominal tenderness.  Hernia: No hernia is present.  Genitourinary:    Comments: Deferred  Musculoskeletal:     Right lower leg: No edema.     Left lower leg: No edema.  Lymphadenopathy:     Cervical: No cervical adenopathy.  Skin:    General: Skin is warm.  Neurological:     General: No focal deficit present.     Mental Status: He is alert.     Deep Tendon Reflexes:     Reflex Scores:      Bicep reflexes are 2+ on the right side and 2+ on the left side.      Patellar reflexes are 2+ on the right side and 2+ on the left side.    Comments: Bilateral upper and lower extremity strength 5/5  Psychiatric:        Mood and Affect: Mood normal.        Behavior: Behavior normal.        Thought Content: Thought content normal.        Judgment: Judgment normal.      No results found for any visits on 11/19/24.    The 10-year ASCVD  risk score (Arnett DK, et al., 2019) is: 6.4%    Assessment & Plan:   Problem List Items Addressed This Visit       Other   Preventative health care - Primary   Discussed age-appropriate immunizations and screening exams.  Did review patient's personal, surgical, social, family histories.  Patient is up-to-date on all age-appropriate vaccinations he would like.  Update Prevnar 20 today.  Patient is up-to-date on CRC screening.  PSA today for prostate cancer screening.  Patient was given information at discharge about preventative healthcare maintenance with anticipatory guidance.      Relevant Orders   CBC with Differential/Platelet   Comprehensive metabolic panel with GFR   TSH   Hypercholesterolemia   Pending lipid panel today      Relevant Orders   Hemoglobin A1c   Lipid panel   Obesity (BMI 30-39.9)   Patient was exercising more regularly approxi-1 month ago.  Did encourage patient to get back to normal routine.  Did review medical recommendation of 5 days a week and 30 minutes a day of exercise.  Pending TSH, A1c, lipid panel.      Relevant Orders   Hemoglobin A1c   TSH   Lipid panel   Other Visit Diagnoses       Screening for prostate cancer       Relevant Orders   PSA     Need for pneumococcal 20-valent conjugate vaccination       Relevant Orders   Pneumococcal conjugate vaccine 20-valent (Prevnar 20)     Screening for diabetes mellitus       Relevant Orders   Hemoglobin A1c       Return in about 1 year (around 11/19/2025) for CPE and Labs.    Adina Crandall, NP

## 2024-11-19 NOTE — Assessment & Plan Note (Signed)
Pending lipid panel today 

## 2024-11-19 NOTE — Assessment & Plan Note (Signed)
 Patient was exercising more regularly approxi-1 month ago.  Did encourage patient to get back to normal routine.  Did review medical recommendation of 5 days a week and 30 minutes a day of exercise.  Pending TSH, A1c, lipid panel.

## 2024-11-19 NOTE — Assessment & Plan Note (Signed)
 Discussed age-appropriate immunizations and screening exams.  Did review patient's personal, surgical, social, family histories.  Patient is up-to-date on all age-appropriate vaccinations he would like.  Update Prevnar 20 today.  Patient is up-to-date on CRC screening.  PSA today for prostate cancer screening.  Patient was given information at discharge about preventative healthcare maintenance with anticipatory guidance.

## 2024-11-19 NOTE — Patient Instructions (Signed)
 Nice to see you today I will be in touch with the labs once I have them  Follow up with me in 1 year, sooner if you need me   We did update your pneumonia vaccine today (prevnar 20)

## 2024-11-20 LAB — COMPREHENSIVE METABOLIC PANEL WITH GFR
AG Ratio: 2.1 (calc) (ref 1.0–2.5)
ALT: 28 U/L (ref 9–46)
AST: 20 U/L (ref 10–35)
Albumin: 4.6 g/dL (ref 3.6–5.1)
Alkaline phosphatase (APISO): 67 U/L (ref 35–144)
BUN: 17 mg/dL (ref 7–25)
CO2: 30 mmol/L (ref 20–32)
Calcium: 9.5 mg/dL (ref 8.6–10.3)
Chloride: 100 mmol/L (ref 98–110)
Creat: 1.2 mg/dL (ref 0.70–1.30)
Globulin: 2.2 g/dL (ref 1.9–3.7)
Glucose, Bld: 102 mg/dL — ABNORMAL HIGH (ref 65–99)
Potassium: 3.9 mmol/L (ref 3.5–5.3)
Sodium: 138 mmol/L (ref 135–146)
Total Bilirubin: 0.5 mg/dL (ref 0.2–1.2)
Total Protein: 6.8 g/dL (ref 6.1–8.1)
eGFR: 72 mL/min/1.73m2 (ref >=60)

## 2024-11-20 LAB — CBC WITH DIFFERENTIAL/PLATELET
Absolute Lymphocytes: 1642 {cells}/uL (ref 850–3900)
Absolute Monocytes: 531 {cells}/uL (ref 200–950)
Basophils Absolute: 83 {cells}/uL (ref 0–200)
Basophils Relative: 1.2 %
Eosinophils Absolute: 311 {cells}/uL (ref 15–500)
Eosinophils Relative: 4.5 %
HCT: 44.4 % (ref 39.4–51.1)
Hemoglobin: 14.5 g/dL (ref 13.2–17.1)
MCH: 29.1 pg (ref 27.0–33.0)
MCHC: 32.7 g/dL (ref 31.6–35.4)
MCV: 89 fL (ref 81.4–101.7)
MPV: 10.7 fL (ref 7.5–12.5)
Monocytes Relative: 7.7 %
Neutro Abs: 4333 {cells}/uL (ref 1500–7800)
Neutrophils Relative %: 62.8 %
Platelets: 260 Thousand/uL (ref 140–400)
RBC: 4.99 Million/uL (ref 4.20–5.80)
RDW: 13 % (ref 11.0–15.0)
Total Lymphocyte: 23.8 %
WBC: 6.9 Thousand/uL (ref 3.8–10.8)

## 2024-11-20 LAB — PSA: PSA: 1.6 ng/mL (ref <=4.00)

## 2024-11-20 LAB — LIPID PANEL
Cholesterol: 231 mg/dL — ABNORMAL HIGH (ref <200)
HDL: 51 mg/dL (ref >=40)
LDL Cholesterol (Calc): 140 mg/dL — ABNORMAL HIGH
Non-HDL Cholesterol (Calc): 180 mg/dL — ABNORMAL HIGH (ref <130)
Total CHOL/HDL Ratio: 4.5 (calc) (ref <5.0)
Triglycerides: 258 mg/dL — ABNORMAL HIGH (ref <150)

## 2024-11-20 LAB — TSH: TSH: 2.15 m[IU]/L (ref 0.40–4.50)

## 2024-11-20 LAB — HEMOGLOBIN A1C
Hgb A1c MFr Bld: 5.6 % (ref <5.7)
Mean Plasma Glucose: 114 mg/dL
eAG (mmol/L): 6.3 mmol/L

## 2024-11-22 ENCOUNTER — Ambulatory Visit: Payer: Self-pay | Admitting: Nurse Practitioner

## 2025-11-21 ENCOUNTER — Encounter: Admitting: Nurse Practitioner
